# Patient Record
Sex: Female | Born: 1955
Health system: Southern US, Community
[De-identification: ages and names within clinical notes are randomized; demographics above are authoritative.]

## PROBLEM LIST (undated history)

## (undated) ENCOUNTER — Emergency Department: Admission: EM | Discharge status: Absent without leave | Payer: Medicare Other

## (undated) DIAGNOSIS — F419 Anxiety disorder, unspecified: Secondary | ICD-10-CM

## (undated) DIAGNOSIS — E119 Type 2 diabetes mellitus without complications: Secondary | ICD-10-CM

## (undated) DIAGNOSIS — M419 Scoliosis, unspecified: Secondary | ICD-10-CM

## (undated) DIAGNOSIS — G4730 Sleep apnea, unspecified: Secondary | ICD-10-CM

## (undated) DIAGNOSIS — G8929 Other chronic pain: Secondary | ICD-10-CM

## (undated) DIAGNOSIS — F32A Depression, unspecified: Secondary | ICD-10-CM

## (undated) DIAGNOSIS — G2581 Restless legs syndrome: Secondary | ICD-10-CM

## (undated) DIAGNOSIS — F329 Major depressive disorder, single episode, unspecified: Secondary | ICD-10-CM

## (undated) DIAGNOSIS — G4700 Insomnia, unspecified: Secondary | ICD-10-CM

## (undated) DIAGNOSIS — E785 Hyperlipidemia, unspecified: Secondary | ICD-10-CM

## (undated) DIAGNOSIS — R55 Syncope and collapse: Secondary | ICD-10-CM

## (undated) DIAGNOSIS — K5730 Diverticulosis of large intestine without perforation or abscess without bleeding: Secondary | ICD-10-CM

## (undated) HISTORY — DX: Insomnia, unspecified: G47.00

## (undated) HISTORY — DX: Restless legs syndrome: G25.81

## (undated) HISTORY — DX: Diverticulosis of large intestine without perforation or abscess without bleeding: K57.30

## (undated) HISTORY — PX: APPENDECTOMY: SHX54

## (undated) HISTORY — DX: Syncope and collapse: R55

## (undated) HISTORY — DX: Sleep apnea, unspecified: G47.30

## (undated) HISTORY — PX: BREAST EXCISIONAL BIOPSY: SUR124

## (undated) HISTORY — PX: ABDOMINAL HYSTERECTOMY: SHX81

## (undated) HISTORY — PX: KNEE SURGERY: SHX244

## (undated) HISTORY — DX: Type 2 diabetes mellitus without complications: E11.9

## (undated) HISTORY — DX: Hyperlipidemia, unspecified: E78.5

## (undated) HISTORY — DX: Other chronic pain: G89.29

---

## 2004-10-15 ENCOUNTER — Ambulatory Visit: Payer: Self-pay

## 2004-10-17 ENCOUNTER — Ambulatory Visit: Payer: Self-pay | Admitting: Unknown Physician Specialty

## 2004-10-30 ENCOUNTER — Other Ambulatory Visit: Payer: Self-pay

## 2004-11-03 ENCOUNTER — Ambulatory Visit: Payer: Self-pay | Admitting: Surgery

## 2005-01-16 ENCOUNTER — Ambulatory Visit: Payer: Self-pay

## 2008-07-23 ENCOUNTER — Ambulatory Visit: Payer: Self-pay | Admitting: Family Medicine

## 2009-01-09 DIAGNOSIS — I10 Essential (primary) hypertension: Secondary | ICD-10-CM | POA: Insufficient documentation

## 2009-01-09 DIAGNOSIS — J4530 Mild persistent asthma, uncomplicated: Secondary | ICD-10-CM | POA: Insufficient documentation

## 2010-08-22 DIAGNOSIS — M1710 Unilateral primary osteoarthritis, unspecified knee: Secondary | ICD-10-CM | POA: Insufficient documentation

## 2010-10-03 ENCOUNTER — Emergency Department: Payer: Self-pay | Admitting: Emergency Medicine

## 2011-05-12 DIAGNOSIS — G4733 Obstructive sleep apnea (adult) (pediatric): Secondary | ICD-10-CM | POA: Insufficient documentation

## 2011-08-11 DIAGNOSIS — H905 Unspecified sensorineural hearing loss: Secondary | ICD-10-CM | POA: Insufficient documentation

## 2012-12-20 DIAGNOSIS — K5792 Diverticulitis of intestine, part unspecified, without perforation or abscess without bleeding: Secondary | ICD-10-CM | POA: Insufficient documentation

## 2013-01-09 DIAGNOSIS — G8929 Other chronic pain: Secondary | ICD-10-CM | POA: Insufficient documentation

## 2013-01-09 DIAGNOSIS — M25569 Pain in unspecified knee: Secondary | ICD-10-CM | POA: Insufficient documentation

## 2014-03-03 ENCOUNTER — Emergency Department: Payer: Self-pay | Admitting: Emergency Medicine

## 2014-03-03 LAB — BASIC METABOLIC PANEL
ANION GAP: 7 (ref 7–16)
BUN: 12 mg/dL (ref 7–18)
CREATININE: 0.66 mg/dL (ref 0.60–1.30)
Calcium, Total: 8.5 mg/dL (ref 8.5–10.1)
Chloride: 105 mmol/L (ref 98–107)
Co2: 29 mmol/L (ref 21–32)
EGFR (African American): >60
EGFR (Non-African Amer.): >60
GLUCOSE: 165 mg/dL — AB (ref 65–99)
Osmolality: 285 (ref 275–301)
POTASSIUM: 3.9 mmol/L (ref 3.5–5.1)
SODIUM: 141 mmol/L (ref 136–145)

## 2014-03-03 LAB — TROPONIN I

## 2014-03-03 LAB — CBC
HCT: 39.6 % (ref 35.0–47.0)
HGB: 13 g/dL (ref 12.0–16.0)
MCH: 27.8 pg (ref 26.0–34.0)
MCHC: 32.8 g/dL (ref 32.0–36.0)
MCV: 85 fL (ref 80–100)
Platelet: 230 10*3/uL (ref 150–440)
RBC: 4.68 10*6/uL (ref 3.80–5.20)
RDW: 14.2 % (ref 11.5–14.5)
WBC: 7.3 10*3/uL (ref 3.6–11.0)

## 2014-03-06 DIAGNOSIS — F329 Major depressive disorder, single episode, unspecified: Secondary | ICD-10-CM | POA: Insufficient documentation

## 2014-03-06 DIAGNOSIS — F32A Depression, unspecified: Secondary | ICD-10-CM | POA: Insufficient documentation

## 2014-03-06 DIAGNOSIS — I10 Essential (primary) hypertension: Secondary | ICD-10-CM | POA: Insufficient documentation

## 2014-03-06 DIAGNOSIS — E119 Type 2 diabetes mellitus without complications: Secondary | ICD-10-CM | POA: Insufficient documentation

## 2014-03-06 DIAGNOSIS — E782 Mixed hyperlipidemia: Secondary | ICD-10-CM | POA: Insufficient documentation

## 2014-04-24 DIAGNOSIS — R55 Syncope and collapse: Secondary | ICD-10-CM | POA: Insufficient documentation

## 2014-04-27 DIAGNOSIS — K228 Other specified diseases of esophagus: Secondary | ICD-10-CM | POA: Insufficient documentation

## 2014-04-27 DIAGNOSIS — K2289 Other specified disease of esophagus: Secondary | ICD-10-CM | POA: Insufficient documentation

## 2014-08-07 DIAGNOSIS — J410 Simple chronic bronchitis: Secondary | ICD-10-CM | POA: Insufficient documentation

## 2014-08-07 DIAGNOSIS — R5382 Chronic fatigue, unspecified: Secondary | ICD-10-CM

## 2014-08-07 DIAGNOSIS — G2581 Restless legs syndrome: Secondary | ICD-10-CM | POA: Insufficient documentation

## 2014-08-07 DIAGNOSIS — D8989 Other specified disorders involving the immune mechanism, not elsewhere classified: Secondary | ICD-10-CM | POA: Insufficient documentation

## 2014-08-07 DIAGNOSIS — G4700 Insomnia, unspecified: Secondary | ICD-10-CM | POA: Insufficient documentation

## 2014-10-11 ENCOUNTER — Emergency Department
Admission: EM | Admit: 2014-10-11 | Discharge: 2014-10-11 | Disposition: A | Discharge status: Home / Self Care | Payer: Medicare Other | Attending: Emergency Medicine | Admitting: Emergency Medicine

## 2014-10-11 ENCOUNTER — Encounter: Payer: Self-pay | Admitting: Emergency Medicine

## 2014-10-11 DIAGNOSIS — Z79899 Other long term (current) drug therapy: Secondary | ICD-10-CM | POA: Insufficient documentation

## 2014-10-11 DIAGNOSIS — Z7982 Long term (current) use of aspirin: Secondary | ICD-10-CM | POA: Diagnosis not present

## 2014-10-11 DIAGNOSIS — M25511 Pain in right shoulder: Secondary | ICD-10-CM | POA: Diagnosis present

## 2014-10-11 DIAGNOSIS — M75101 Unspecified rotator cuff tear or rupture of right shoulder, not specified as traumatic: Secondary | ICD-10-CM | POA: Diagnosis not present

## 2014-10-11 DIAGNOSIS — M67911 Unspecified disorder of synovium and tendon, right shoulder: Secondary | ICD-10-CM

## 2014-10-11 HISTORY — DX: Scoliosis, unspecified: M41.9

## 2014-10-11 HISTORY — DX: Anxiety disorder, unspecified: F41.9

## 2014-10-11 HISTORY — DX: Depression, unspecified: F32.A

## 2014-10-11 HISTORY — DX: Major depressive disorder, single episode, unspecified: F32.9

## 2014-10-11 MED ORDER — NAPROXEN 500 MG PO TABS: 500.0000 mg | ORAL_TABLET | Freq: Two times a day (BID) | ORAL | Status: DC

## 2014-10-11 NOTE — ED Provider Notes (Signed)
[facility] Emergency Department Provider Note  ____________________________________________  Time seen: On arrival  I have reviewed the triage vital signs and the nursing notes.   HISTORY  Chief Complaint Arm Pain    HPI Shelby Butler is a 59 y.o. female who presents with complaints of right shoulder pain since January 2016. At that time she fell and injured her shoulder and she reports this continued to bother her. It is worse when she tries to lift it above her head. She denies tingling or numbness in the extremity. No chest pain. Her elbow and wrist do not hurt. She did go to physical therapy twice but became too expensive. If She is not moving the arm she has no pain.    Past Medical History  Diagnosis Date  . Depression   . Anxiety   . Scoliosis     There are no active problems to display for this patient.   History reviewed. No pertinent past surgical history.  Current Outpatient Rx  Name  Route  Sig  Dispense  Refill  . ADVAIR DISKUS 250-50 MCG/DOSE AEPB   Oral   Take 1 puff by mouth 2 (two) times daily.      11     Dispense as written.   Marland Kitchen aspirin EC 81 MG tablet   Oral   Take 81 mg by mouth daily.         Marland Kitchen atorvastatin (LIPITOR) 40 MG tablet   Oral   Take 1 tablet by mouth daily.      11   . baclofen (LIORESAL) 10 MG tablet   Oral   Take 1 tablet by mouth 2 (two) times daily as needed.      0   . buPROPion (WELLBUTRIN XL) 150 MG 24 hr tablet   Oral   Take 3 tablets by mouth every morning.      2   . gabapentin (NEURONTIN) 800 MG tablet   Oral   Take 1 tablet by mouth at bedtime.      3   . isosorbide mononitrate (IMDUR) 30 MG 24 hr tablet   Oral   Take 1 tablet by mouth daily.         Marland Kitchen lisinopril (PRINIVIL,ZESTRIL) 5 MG tablet   Oral   Take 1 tablet by mouth daily.      3   . metformin (FORTAMET) 1000 MG (OSM) 24 hr tablet   Oral   Take 1 tablet by mouth 2 (two) times daily with a meal.       3   . metoprolol tartrate (LOPRESSOR) 25 MG tablet   Oral   Take 12.5 mg by mouth 2 (two) times daily.          . traZODone (DESYREL) 50 MG tablet   Oral   Take 1 tablet by mouth at bedtime.      0   . naproxen (NAPROSYN) 500 MG tablet   Oral   Take 1 tablet (500 mg total) by mouth 2 (two) times daily with a meal.   20 tablet   2   . PROAIR HFA 108 (90 BASE) MCG/ACT inhaler   Oral   Take 2 puffs by mouth every 4 (four) hours as needed.      5     Dispense as written.     Allergies Review of patient's allergies indicates no known allergies.  No family history on file.  Social History History  Substance Use Topics  .  Smoking status: Not on file  . Smokeless tobacco: Not on file  . Alcohol Use: Not on file    Review of Systems  Constitutional: Negative for fever. Eyes: Negative for visual changes. ENT: Negative for sore throat   Genitourinary: Negative for dysuria. Musculoskeletal: Negative for back pain. Skin: Negative for rash. Neurological: Negative for headaches or focal weakness   ____________________________________________   PHYSICAL EXAM:  VITAL SIGNS: ED Triage Vitals  Enc Vitals Group     BP 10/11/14 0949 139/77 mmHg     Pulse Rate 10/11/14 0949 67     Resp 10/11/14 0949 20     Temp 10/11/14 0949 98.6 F (37 C)     Temp Source 10/11/14 0949 Oral     SpO2 10/11/14 0949 96 %     Weight 10/11/14 0949 168 lb (76.204 kg)     Height 10/11/14 0949  (1.6 m)     Head Cir --      Peak Flow --      Pain Score 10/11/14 0949 8     Pain Loc --      Pain Edu? --      Excl. in GC? --      Constitutional: Alert and oriented. Well appearing and in no distress. Eyes: Conjunctivae are normal.  ENT   Head: Normocephalic and atraumatic.   Mouth/Throat: Mucous membranes are moist. Cardiovascular: Normal rate, regular rhythm.  Respiratory: Normal respiratory effort without tachypnea nor retractions.  Gastrointestinal: Soft and  non-tender in all quadrants. No distention. There is no CVA tenderness. Musculoskeletal: Right arm with mild tenderness to palpation in the anterior shoulder. She is unable to bring her arm above 90. There is no bony tenderness to palpation or abnormality. Soft compartments. Normal cap refill and normal pulses distal extremity Neurologic:  Normal speech and language. No gross focal neurologic deficits are appreciated. Skin:  Skin is warm, dry and intact. No rash noted. Psychiatric: Mood and affect are normal. Patient exhibits appropriate insight and judgment.  ____________________________________________    LABS (pertinent positives/negatives)  Labs Reviewed - No data to display  ____________________________________________     ____________________________________________    RADIOLOGY I have personally reviewed any xrays that were ordered on this patient: None  ____________________________________________   PROCEDURES  Procedure(s) performed: none   ____________________________________________   INITIAL IMPRESSION / ASSESSMENT AND PLAN / ED COURSE  Pertinent labs & imaging results that were available during my care of the patient were reviewed by me and considered in my medical decision making (see chart for details).  History of present illness an exam most consistent with rotator cuff injury. I discussed with her how she will need an MRI follow-up with orthopedics. We'll treat her with NSAIDs and she agrees with this plan  ____________________________________________   FINAL CLINICAL IMPRESSION(S) / ED DIAGNOSES  Final diagnoses:  Dysfunction of right rotator cuff     Jene Every, MD 10/11/14 1053

## 2014-10-11 NOTE — ED Notes (Signed)
Computer would not load in room, unable to e-sign for discharge.

## 2014-10-11 NOTE — Discharge Instructions (Signed)
Rotator Cuff Injury Rotator cuff injury is any type of injury to the set of muscles and tendons that make up the stabilizing unit of your shoulder. This unit holds the ball of your upper arm bone (humerus) in the socket of your shoulder blade (scapula).  CAUSES Injuries to your rotator cuff most commonly come from sports or activities that cause your arm to be moved repeatedly over your head. Examples of this include throwing, weight lifting, swimming, or racquet sports. Long lasting (chronic) irritation of your rotator cuff can cause soreness and swelling (inflammation), bursitis, and eventual damage to your tendons, such as a tear (rupture). SIGNS AND SYMPTOMS Acute rotator cuff tear:  Sudden tearing sensation followed by severe pain shooting from your upper shoulder down your arm toward your elbow.  Decreased range of motion of your shoulder because of pain and muscle spasm.  Severe pain.  Inability to raise your arm out to the side because of pain and loss of muscle power (large tears). Chronic rotator cuff tear:  Pain that usually is worse at night and may interfere with sleep.  Gradual weakness and decreased shoulder motion as the pain worsens.  Decreased range of motion. Rotator cuff tendinitis:  Deep ache in your shoulder and the outside upper arm over your shoulder.  Pain that comes on gradually and becomes worse when lifting your arm to the side or turning it inward. DIAGNOSIS Rotator cuff injury is diagnosed through a medical history, physical exam, and imaging exam. The medical history helps determine the type of rotator cuff injury. Your health care provider will look at your injured shoulder, feel the injured area, and ask you to move your shoulder in different positions. X-ray exams typically are done to rule out other causes of shoulder pain, such as fractures. MRI is the exam of choice for the most severe shoulder injuries because the images show muscles and tendons.    TREATMENT  Chronic tear:  Medicine for pain, such as acetaminophen or ibuprofen.  Physical therapy and range-of-motion exercises may be helpful in maintaining shoulder function and strength.  Steroid injections into your shoulder joint.  Surgical repair of the rotator cuff if the injury does not heal with noninvasive treatment. Acute tear:  Anti-inflammatory medicines such as ibuprofen and naproxen to help reduce pain and swelling.  A sling to help support your arm and rest your rotator cuff muscles. Long-term use of a sling is not advised. It may cause significant stiffening of the shoulder joint.  Surgery may be considered within a few weeks, especially in younger, active people, to return the shoulder to full function.  Indications for surgical treatment include the following:  Age younger than 60 years.  Rotator cuff tears that are complete.  Physical therapy, rest, and anti-inflammatory medicines have been used for 6-8 weeks, with no improvement.  Employment or sporting activity that requires constant shoulder use. Tendinitis:  Anti-inflammatory medicines such as ibuprofen and naproxen to help reduce pain and swelling.  A sling to help support your arm and rest your rotator cuff muscles. Long-term use of a sling is not advised. It may cause significant stiffening of the shoulder joint.  Severe tendinitis may require:  Steroid injections into your shoulder joint.  Physical therapy.  Surgery. HOME CARE INSTRUCTIONS   Apply ice to your injury:  Put ice in a plastic bag.  Place a towel between your skin and the bag.  Leave the ice on for 20 minutes, 2-3 times a day.  If you   have a shoulder immobilizer (sling and straps), wear it until told otherwise by your health care provider.  You may want to sleep on several pillows or in a recliner at night to lessen swelling and pain.  Only take over-the-counter or prescription medicines for pain, discomfort, or fever as  directed by your health care provider.  Do simple hand squeezing exercises with a soft rubber ball to decrease hand swelling. SEEK MEDICAL CARE IF:   Your shoulder pain increases, or new pain or numbness develops in your arm, hand, or fingers.  Your hand or fingers are colder than your other hand. SEEK IMMEDIATE MEDICAL CARE IF:   Your arm, hand, or fingers are numb or tingling.  Your arm, hand, or fingers are increasingly swollen and painful, or they turn white or blue. MAKE SURE YOU:  Understand these instructions.  Will watch your condition.  Will get help right away if you are not doing well or get worse. Document Released: 02/21/2000 Document Revised: 02/28/2013 Document Reviewed: 10/05/2012 Exit[employer] Patient Information 2015 [employer]. This information is not intended to replace advice given to you by your health care provider. Make sure you discuss any questions you have with your health care provider.  

## 2014-10-11 NOTE — ED Notes (Signed)
Patient presents to the ED with right arm and right shoulder pain since an accident in January.  Patient states she has been going to physical therapy but it hasn't improved her pain.  Patient states she has lately been having difficulty sleeping due to the pain.

## 2014-10-11 NOTE — ED Notes (Signed)
January of 2016 fell on cement, increasing pain , difficulty with full ROM

## 2014-10-12 DIAGNOSIS — M7520 Bicipital tendinitis, unspecified shoulder: Secondary | ICD-10-CM | POA: Insufficient documentation

## 2014-10-16 ENCOUNTER — Other Ambulatory Visit: Payer: Self-pay | Admitting: Specialist

## 2014-10-16 DIAGNOSIS — M7521 Bicipital tendinitis, right shoulder: Secondary | ICD-10-CM

## 2014-10-20 ENCOUNTER — Ambulatory Visit
Admission: RE | Admit: 2014-10-20 | Discharge: 2014-10-20 | Disposition: A | Discharge status: Home / Self Care | Payer: Medicare Other | Source: Ambulatory Visit | Attending: Specialist | Admitting: Specialist

## 2014-10-20 DIAGNOSIS — S46811A Strain of other muscles, fascia and tendons at shoulder and upper arm level, right arm, initial encounter: Secondary | ICD-10-CM | POA: Diagnosis not present

## 2014-10-20 DIAGNOSIS — M7521 Bicipital tendinitis, right shoulder: Secondary | ICD-10-CM | POA: Diagnosis present

## 2014-10-20 DIAGNOSIS — S46111A Strain of muscle, fascia and tendon of long head of biceps, right arm, initial encounter: Secondary | ICD-10-CM | POA: Insufficient documentation

## 2014-10-23 ENCOUNTER — Ambulatory Visit: Payer: Medicare Other

## 2014-10-29 ENCOUNTER — Encounter: Payer: Self-pay | Admitting: Unknown Physician Specialty

## 2014-10-29 ENCOUNTER — Ambulatory Visit (INDEPENDENT_AMBULATORY_CARE_PROVIDER_SITE_OTHER): Payer: Medicare Other | Admitting: Unknown Physician Specialty

## 2014-10-29 VITALS — BP 101/64 | HR 80 | Temp 97.8°F | Ht 64.0 in | Wt 170.2 lb

## 2014-10-29 DIAGNOSIS — G4700 Insomnia, unspecified: Secondary | ICD-10-CM | POA: Diagnosis not present

## 2014-10-29 DIAGNOSIS — N189 Chronic kidney disease, unspecified: Secondary | ICD-10-CM

## 2014-10-29 DIAGNOSIS — E1122 Type 2 diabetes mellitus with diabetic chronic kidney disease: Secondary | ICD-10-CM

## 2014-10-29 DIAGNOSIS — E785 Hyperlipidemia, unspecified: Secondary | ICD-10-CM | POA: Diagnosis not present

## 2014-10-29 DIAGNOSIS — G930 Cerebral cysts: Secondary | ICD-10-CM | POA: Insufficient documentation

## 2014-10-29 DIAGNOSIS — I129 Hypertensive chronic kidney disease with stage 1 through stage 4 chronic kidney disease, or unspecified chronic kidney disease: Secondary | ICD-10-CM | POA: Diagnosis not present

## 2014-10-29 LAB — BAYER DCA HB A1C WAIVED: HB A1C (BAYER DCA - WAIVED): 6.7 % (ref <7.0)

## 2014-10-29 LAB — MICROALBUMIN, URINE WAIVED
Creatinine, Urine Waived: 300 mg/dL (ref 10–300)
Microalb, Ur Waived: 80 mg/L — ABNORMAL HIGH (ref 0–19)

## 2014-10-29 NOTE — Assessment & Plan Note (Addendum)
Last labs done 6 months ago.  Hgb A1C is 6.7.

## 2014-10-29 NOTE — Assessment & Plan Note (Signed)
Lipid panel and liver enzymes done at cardiology

## 2014-10-29 NOTE — Progress Notes (Signed)
BP 101/64 mmHg  Pulse 80  Temp(Src) 97.8 F (36.6 C)  Ht 5\' 4"  (1.626 m)  Wt 170 lb 3.2 oz (77.202 kg)  BMI 29.20 kg/m2  SpO2 95%  LMP  (LMP Unknown)   Subjective:    Patient ID: Shelby Butler, female    DOB: 10/12/1955, 59 y.o.   MRN: [id number]  HPI: Shelby Butler is a 58 y.o. female  Chief Complaint  Patient presents with  . Establish Care   Diabetes She presents for her follow-up diabetic visit. She has type 2 diabetes mellitus. There are no hypoglycemic associated symptoms. There are no diabetic associated symptoms. Pertinent negatives for diabetes include no chest pain. There are no hypoglycemic complications. Symptoms are stable. There are no diabetic complications. Her weight is stable. She is following a diabetic diet. Eye exam is not current.  Hypertension This is a chronic problem. The problem is controlled. Pertinent negatives include no chest pain or shortness of breath. There are no compliance problems.   Hyperlipidemia This is a chronic (Under the care of Dr. Gwen Pounds) problem. The problem is controlled. Recent lipid tests were reviewed and are normal. Pertinent negatives include no chest pain, focal sensory loss, focal weakness, leg pain, myalgias or shortness of breath. There are no compliance problems.   Insomnia Primary symptoms: fragmented sleep, difficulty falling asleep, somnolence, frequent awakening.  The current episode started more than one month. The problem occurs nightly. The problem is unchanged. The symptoms are aggravated by caffeine and pain. How many beverages per day that contain caffeine: 2-3.  Types of beverages you drink: soda. Treatments tried: Trazadone. PMH includes: hypertension.    Relevant past medical, surgical, family and social history reviewed and updated as indicated. Interim medical history since our last visit reviewed. Allergies and medications reviewed and updated.  Review of Systems  Respiratory: Negative for shortness of  breath.   Cardiovascular: Negative for chest pain.  Musculoskeletal: Negative for myalgias.       Chronic shoulder pain right shoulder under the care of Dr. Hyacinth Meeker  Neurological: Negative for focal weakness.  Psychiatric/Behavioral: The patient has insomnia.    Per HPI unless specifically indicated above     Objective:    BP 101/64 mmHg  Pulse 80  Temp(Src) 97.8 F (36.6 C)  Ht 5\' 4"  (1.626 m)  Wt 170 lb 3.2 oz (77.202 kg)  BMI 29.20 kg/m2  SpO2 95%  LMP  (LMP Unknown)  Wt Readings from Last 3 Encounters:  10/29/14 170 lb 3.2 oz (77.202 kg)  10/11/14 168 lb (76.204 kg)    Physical Exam  Constitutional: She is oriented to person, place, and time. She appears well-developed and well-nourished. No distress.  HENT:  Head: Normocephalic and atraumatic.  Eyes: Conjunctivae and lids are normal. Right eye exhibits no discharge. Left eye exhibits no discharge. No scleral icterus.  Cardiovascular: Normal rate, regular rhythm and normal heart sounds.   Pulmonary/Chest: Effort normal and breath sounds normal. No respiratory distress.  Abdominal: Normal appearance and bowel sounds are normal. There is no splenomegaly or hepatomegaly.  Musculoskeletal: Normal range of motion.  Neurological: She is alert and oriented to person, place, and time.  Skin: Skin is intact. No rash noted. No pallor.  Psychiatric: She has a normal mood and affect. Her behavior is normal. Judgment and thought content normal.   Assessment & Plan:   Problem List Items Addressed This Visit      Unprioritized   Benign essential HTN   HLD (  hyperlipidemia)    Lipid panel and liver enzymes done at cardiology      Cannot sleep    Chronic insomnia.  Discussed discontinuing caffeine and working on an exercise program.        Relevant Orders   TSH   Diabetes mellitus, type 2 - Primary    Last labs done 6 months ago.  Hgb A1C is 6.7.        Relevant Orders   Comprehensive metabolic panel   Bayer DCA Hb W0J  Waived   Microalbumin, Urine Waived       Follow up plan: Return for Schedule physical.

## 2014-10-29 NOTE — Assessment & Plan Note (Signed)
Chronic insomnia.  Discussed discontinuing caffeine and working on an exercise program.

## 2014-10-29 NOTE — Patient Instructions (Addendum)
Insomnia Insomnia is frequent trouble falling and/or staying asleep. Insomnia can be a long term problem or a short term problem. Both are common. Insomnia can be a short term problem when the wakefulness is related to a certain stress or worry. Long term insomnia is often related to ongoing stress during waking hours and/or poor sleeping habits. Overtime, sleep deprivation itself can make the problem worse. Every little thing feels more severe because you are overtired and your ability to cope is decreased. CAUSES   Stress, anxiety, and depression.  Poor sleeping habits.  Distractions such as TV in the bedroom.  Naps close to bedtime.  Engaging in emotionally charged conversations before bed.  Technical reading before sleep.  Alcohol and other sedatives. They may make the problem worse. They can hurt normal sleep patterns and normal dream activity.  Stimulants such as caffeine for several hours prior to bedtime.  Pain syndromes and shortness of breath can cause insomnia.  Exercise late at night.  Changing time zones may cause sleeping problems (jet lag). It is sometimes helpful to have someone observe your sleeping patterns. They should look for periods of not breathing during the night (sleep apnea). They should also look to see how long those periods last. If you live alone or observers are uncertain, you can also be observed at a sleep clinic where your sleep patterns will be professionally monitored. Sleep apnea requires a checkup and treatment. Give your caregivers your medical history. Give your caregivers observations your family has made about your sleep.  SYMPTOMS   Not feeling rested in the morning.  Anxiety and restlessness at bedtime.  Difficulty falling and staying asleep. TREATMENT   Your caregiver may prescribe treatment for an underlying medical disorders. Your caregiver can give advice or help if you are using alcohol or other drugs for self-medication. Treatment  of underlying problems will usually eliminate insomnia problems.  Medications can be prescribed for short time use. They are generally not recommended for lengthy use.  Over-the-counter sleep medicines are not recommended for lengthy use. They can be habit forming.  You can promote easier sleeping by making lifestyle changes such as:  Using relaxation techniques that help with breathing and reduce muscle tension.  Exercising earlier in the day.  Changing your diet and the time of your last meal. No night time snacks.  Establish a regular time to go to bed.  Counseling can help with stressful problems and worry.  Soothing music and white noise may be helpful if there are background noises you cannot remove.  Stop tedious detailed work at least one hour before bedtime. HOME CARE INSTRUCTIONS   Keep a diary. Inform your caregiver about your progress. This includes any medication side effects. See your caregiver regularly. Take note of:  Times when you are asleep.  Times when you are awake during the night.  The quality of your sleep.  How you feel the next day. This information will help your caregiver care for you.  Get out of bed if you are still awake after 15 minutes. Read or do some quiet activity. Keep the lights down. Wait until you feel sleepy and go back to bed.  Keep regular sleeping and waking hours. Avoid naps.  Exercise regularly.  Avoid distractions at bedtime. Distractions include watching television or engaging in any intense or detailed activity like attempting to balance the household checkbook.  Develop a bedtime ritual. Keep a familiar routine of bathing, brushing your teeth, climbing into bed at the same   time each night, listening to soothing music. Routines increase the success of falling to sleep faster.  Use relaxation techniques. This can be using breathing and muscle tension release routines. It can also include visualizing peaceful scenes. You can  also help control troubling or intruding thoughts by keeping your mind occupied with boring or repetitive thoughts like the old concept of counting sheep. You can make it more creative like imagining planting one beautiful flower after another in your backyard garden.  During your day, work to eliminate stress. When this is not possible use some of the previous suggestions to help reduce the anxiety that accompanies stressful situations. MAKE SURE YOU:   Understand these instructions.  Will watch your condition.  Will get help right away if you are not doing well or get worse. Document Released: 02/21/2000 Document Revised: 05/18/2011 Document Reviewed: 03/23/2007 Exit[employer] Patient Information 2015 [employer]. This information is not intended to replace advice given to you by your health care provider. Make sure you discuss any questions you have with your health care provider.  

## 2014-10-30 ENCOUNTER — Encounter: Payer: Self-pay | Admitting: Unknown Physician Specialty

## 2014-10-30 LAB — COMPREHENSIVE METABOLIC PANEL
A/G RATIO: 2.2 (ref 1.1–2.5)
ALT: 16 IU/L (ref 0–32)
AST: 11 IU/L (ref 0–40)
Albumin: 4.3 g/dL (ref 3.5–5.5)
Alkaline Phosphatase: 89 IU/L (ref 39–117)
BUN/Creatinine Ratio: 24 — ABNORMAL HIGH (ref 9–23)
BUN: 20 mg/dL (ref 6–24)
Bilirubin Total: 0.4 mg/dL (ref 0.0–1.2)
CHLORIDE: 100 mmol/L (ref 97–108)
CO2: 27 mmol/L (ref 18–29)
Calcium: 9 mg/dL (ref 8.7–10.2)
Creatinine, Ser: 0.85 mg/dL (ref 0.57–1.00)
GFR, EST AFRICAN AMERICAN: 89 mL/min/{1.73_m2} (ref >59)
GFR, EST NON AFRICAN AMERICAN: 77 mL/min/{1.73_m2} (ref >59)
GLOBULIN, TOTAL: 2 g/dL (ref 1.5–4.5)
Glucose: 129 mg/dL — ABNORMAL HIGH (ref 65–99)
POTASSIUM: 4.5 mmol/L (ref 3.5–5.2)
SODIUM: 144 mmol/L (ref 134–144)
TOTAL PROTEIN: 6.3 g/dL (ref 6.0–8.5)

## 2014-10-30 LAB — URIC ACID: Uric Acid: 3.6 mg/dL (ref 2.5–7.1)

## 2014-10-30 LAB — TSH: TSH: 1.98 u[IU]/mL (ref 0.450–4.500)

## 2014-11-15 ENCOUNTER — Other Ambulatory Visit: Payer: Medicare Other

## 2014-11-21 ENCOUNTER — Encounter: Admission: RE | Payer: Self-pay | Source: Ambulatory Visit

## 2014-11-21 ENCOUNTER — Ambulatory Visit: Admission: RE | Admit: 2014-11-21 | Payer: Medicare Other | Source: Ambulatory Visit | Admitting: Specialist

## 2014-11-21 SURGERY — ARTHROSCOPY, SHOULDER WITH REPAIR, ROTATOR CUFF, OPEN
Anesthesia: Choice | Laterality: Right

## 2014-12-11 ENCOUNTER — Encounter: Payer: Self-pay | Admitting: Unknown Physician Specialty

## 2014-12-11 ENCOUNTER — Ambulatory Visit (INDEPENDENT_AMBULATORY_CARE_PROVIDER_SITE_OTHER): Payer: Medicare Other | Admitting: Unknown Physician Specialty

## 2014-12-11 VITALS — BP 118/70 | HR 74 | Temp 98.1°F | Ht 64.0 in | Wt 177.2 lb

## 2014-12-11 DIAGNOSIS — Z23 Encounter for immunization: Secondary | ICD-10-CM | POA: Diagnosis not present

## 2014-12-11 DIAGNOSIS — Z0000 Encounter for general adult medical examination without abnormal findings: Secondary | ICD-10-CM

## 2014-12-11 NOTE — Progress Notes (Signed)
++++++++++++++++++++++++++++++++++++++++++++++++++++++++++++++++++++++++++++++++++  BP 118/70 mmHg  Pulse 74  Temp(Src) 98.1 F (36.7 C)  Ht  (1.626 m)  Wt 177 lb 3.2 oz (80.377 kg)  BMI 30.40 kg/m2  SpO2 94%  LMP  (LMP Unknown)   Subjective:    Patient ID: Shelby Butler, female    DOB: 16-Dec-1955, 59 y.o.   MRN: [id number]  HPI: Shelby Butler is a 59 y.o. female  Chief Complaint  Patient presents with  . Annual Exam    Relevant past medical, surgical, family and social history reviewed and updated as indicated. Interim medical history since our last visit reviewed. Allergies and medications reviewed and updated.  Chronic disease f/u done last visit.    Review of Systems  Constitutional: Negative.   HENT: Negative.   Eyes: Negative.   Respiratory: Negative.   Cardiovascular: Negative.   Gastrointestinal: Negative.   Endocrine: Negative.   Genitourinary: Negative.   Musculoskeletal: Negative.        Getting shoulder surgery at [facility]  Skin: Negative.   Allergic/Immunologic: Negative.   Neurological: Negative.   Hematological: Negative.   Psychiatric/Behavioral: Negative.     Per HPI unless specifically indicated above     Objective:    BP 118/70 mmHg  Pulse 74  Temp(Src) 98.1 F (36.7 C)  Ht  (1.626 m)  Wt 177 lb 3.2 oz (80.377 kg)  BMI 30.40 kg/m2  SpO2 94%  LMP  (LMP Unknown)  Wt Readings from Last 3 Encounters:  12/11/14 177 lb 3.2 oz (80.377 kg)  10/29/14 170 lb 3.2 oz (77.202 kg)  10/11/14 168 lb (76.204 kg)    Physical Exam  Constitutional: She is oriented to person, place, and time. She appears well-developed and well-nourished.  HENT:  Head: Normocephalic and atraumatic.  Eyes: Pupils are equal, round, and reactive to light. Right eye exhibits no discharge. Left eye exhibits no discharge. No scleral icterus.  Neck: Normal range of motion. Neck supple. Carotid bruit is not present. No thyromegaly present.  Cardiovascular: Normal  rate, regular rhythm and normal heart sounds.  Exam reveals no gallop and no friction rub.   No murmur heard. Pulmonary/Chest: Effort normal and breath sounds normal. No respiratory distress. She has no wheezes. She has no rales.  Abdominal: Soft. Bowel sounds are normal. There is no tenderness. There is no rebound.  Genitourinary: No breast swelling, tenderness or discharge.  Musculoskeletal: Normal range of motion.  Lymphadenopathy:    She has no cervical adenopathy.  Neurological: She is alert and oriented to person, place, and time.  Skin: Skin is warm, dry and intact. No rash noted.  Psychiatric: She has a normal mood and affect. Her speech is normal and behavior is normal. Judgment and thought content normal. Cognition and memory are normal.   Diabetic Foot Exam - Simple   Simple Foot Form  Diabetic Foot exam was performed with the following findings:  Yes 12/11/2014  2:34 PM  Visual Inspection  No deformities, no ulcerations, no other skin breakdown bilaterally:  Yes  Sensation Testing  Intact to touch and monofilament testing bilaterally:  Yes  Pulse Check  Posterior Tibialis and Dorsalis pulse intact bilaterally:  Yes  Comments  She does have some erythema but no breakdown toes.       Results for orders placed or performed in visit on 10/29/14  Comprehensive metabolic panel  Result Value Ref Range   Glucose 129 (H) 65 - 99 mg/dL   BUN 20 6 - 24 mg/dL  Creatinine, Ser 0.85 0.57 - 1.00 mg/dL   GFR calc non Af Amer 77 >59 mL/min/1.73   GFR calc Af Amer 89 >59 mL/min/1.73   BUN/Creatinine Ratio 24 (H) 9 - 23   Sodium 144 134 - 144 mmol/L   Potassium 4.5 3.5 - 5.2 mmol/L   Chloride 100 97 - 108 mmol/L   CO2 27 18 - 29 mmol/L   Calcium 9.0 8.7 - 10.2 mg/dL   Total Protein 6.3 6.0 - 8.5 g/dL   Albumin 4.3 3.5 - 5.5 g/dL   Globulin, Total 2.0 1.5 - 4.5 g/dL   Albumin/Globulin Ratio 2.2 1.1 - 2.5   Bilirubin Total 0.4 0.0 - 1.2 mg/dL   Alkaline Phosphatase 89 39 - 117 IU/L    AST 11 0 - 40 IU/L   ALT 16 0 - 32 IU/L  Bayer DCA Hb A1c Waived  Result Value Ref Range   Bayer DCA Hb A1c Waived 6.7 <7.0 %  Microalbumin, Urine Waived  Result Value Ref Range   Microalb, Ur Waived 80 (H) 0 - 19 mg/L   Creatinine, Urine Waived 300 10 - 300 mg/dL   Microalb/Creat Ratio 30-300 (H) <30 mg/g  Uric acid  Result Value Ref Range   Uric Acid 3.6 2.5 - 7.1 mg/dL  TSH  Result Value Ref Range   TSH 1.980 0.450 - 4.500 uIU/mL      Assessment & Plan:   Problem List Items Addressed This Visit    None    Visit Diagnoses    Immunization due    -  Primary    Relevant Orders    Flu Vaccine QUAD 36+ mos PF IM (Fluarix & Fluzone Quad PF) (Completed)    Annual physical exam            Follow up plan: Return in about 5 months (around 05/11/2015) for 30 min chronic disease.

## 2015-04-02 ENCOUNTER — Telehealth: Payer: Self-pay | Admitting: Unknown Physician Specialty

## 2015-04-02 NOTE — Telephone Encounter (Signed)
Pt would like a call back to discuss CW possibly writing a new rx for her since she is no longer seeing her other doctor in [place].

## 2015-04-02 NOTE — Telephone Encounter (Signed)
She will need to bring it in so we can look at it so I know the mgs of the components.  Or, call pharmacy and get more in formation and ask them if they can send [place] a refill request on the EMR

## 2015-04-02 NOTE — Telephone Encounter (Signed)
Called and spoke to patient. She states she got a liquid medication from her previous doctor in [facility] and she spelled it "q-dryl antacid lidocaine". States she only uses it as needed for mouth sores. Pharmacy is [facility] in [place].

## 2015-04-02 NOTE — Telephone Encounter (Signed)
Called pharmacy and asked about this medication. They stated that the patient called about it earlier and told them that she hasn't gotten it since 2014 and they told her that their system does not go back that far. So I am going to call the patient and see if she can bring the medication to [place] to look at.

## 2015-04-03 NOTE — Telephone Encounter (Signed)
Called and left patient a voicemail asking for her to please return my call.  

## 2015-04-03 NOTE — Telephone Encounter (Signed)
Called and spoke to patient. She is going to bring the medication by in the morning for [place] to take a look at.

## 2015-04-04 MED ORDER — DIPHENHYDRAMINE HCL 12.5 MG/5ML PO LIQD: ORAL | Status: DC

## 2015-04-04 NOTE — Addendum Note (Signed)
Addended by: Gabriel Cirri on: 04/04/2015 10:48 AM   Modules accepted: Orders

## 2015-04-04 NOTE — Telephone Encounter (Signed)
Patient brought medication in and I wrote down exactly what the bottle said. Will give to [facility] when she comes back in the office.

## 2015-04-29 ENCOUNTER — Other Ambulatory Visit: Payer: Self-pay | Admitting: Unknown Physician Specialty

## 2015-05-10 ENCOUNTER — Ambulatory Visit: Payer: Medicare Other | Admitting: Unknown Physician Specialty

## 2015-05-15 ENCOUNTER — Encounter: Payer: Self-pay | Admitting: Unknown Physician Specialty

## 2015-05-15 ENCOUNTER — Ambulatory Visit (INDEPENDENT_AMBULATORY_CARE_PROVIDER_SITE_OTHER): Payer: Medicare Other | Admitting: Unknown Physician Specialty

## 2015-05-15 ENCOUNTER — Other Ambulatory Visit: Payer: Self-pay | Admitting: Unknown Physician Specialty

## 2015-05-15 VITALS — BP 104/63 | HR 81 | Temp 98.5°F | Ht 63.3 in | Wt 178.8 lb

## 2015-05-15 DIAGNOSIS — H9202 Otalgia, left ear: Secondary | ICD-10-CM | POA: Diagnosis not present

## 2015-05-15 DIAGNOSIS — E1122 Type 2 diabetes mellitus with diabetic chronic kidney disease: Secondary | ICD-10-CM

## 2015-05-15 DIAGNOSIS — E785 Hyperlipidemia, unspecified: Secondary | ICD-10-CM

## 2015-05-15 DIAGNOSIS — IMO0001 Reserved for inherently not codable concepts without codable children: Secondary | ICD-10-CM

## 2015-05-15 DIAGNOSIS — M609 Myositis, unspecified: Secondary | ICD-10-CM | POA: Diagnosis not present

## 2015-05-15 DIAGNOSIS — J4540 Moderate persistent asthma, uncomplicated: Secondary | ICD-10-CM | POA: Diagnosis not present

## 2015-05-15 DIAGNOSIS — M791 Myalgia: Secondary | ICD-10-CM

## 2015-05-15 DIAGNOSIS — I10 Essential (primary) hypertension: Secondary | ICD-10-CM

## 2015-05-15 LAB — LIPID PANEL PICCOLO, WAIVED
Chol/HDL Ratio Piccolo,Waive: 2.9 mg/dL
Cholesterol Piccolo, Waived: 114 mg/dL (ref <200)
HDL Chol Piccolo, Waived: 39 mg/dL — ABNORMAL LOW (ref >59)
LDL Chol Calc Piccolo Waived: 58 mg/dL (ref <100)
Triglycerides Piccolo,Waived: 85 mg/dL (ref <150)
VLDL CHOL CALC PICCOLO,WAIVE: 17 mg/dL (ref <30)

## 2015-05-15 LAB — MICROALBUMIN, URINE WAIVED
CREATININE, URINE WAIVED: 200 mg/dL (ref 10–300)
MICROALB, UR WAIVED: 30 mg/L — AB (ref 0–19)

## 2015-05-15 LAB — BAYER DCA HB A1C WAIVED: HB A1C (BAYER DCA - WAIVED): 7.8 % — ABNORMAL HIGH (ref <7.0)

## 2015-05-15 MED ORDER — ATORVASTATIN CALCIUM 40 MG PO TABS: 40.0000 mg | ORAL_TABLET | Freq: Every day | ORAL | Status: AC

## 2015-05-15 MED ORDER — ADVAIR DISKUS 250-50 MCG/DOSE IN AEPB: 1.0000 | INHALATION_SPRAY | Freq: Two times a day (BID) | RESPIRATORY_TRACT | Status: AC

## 2015-05-15 MED ORDER — TRAZODONE HCL 50 MG PO TABS: 50.0000 mg | ORAL_TABLET | Freq: Every day | ORAL | Status: DC

## 2015-05-15 MED ORDER — ISOSORBIDE MONONITRATE ER 30 MG PO TB24: 30.0000 mg | ORAL_TABLET | Freq: Every day | ORAL | Status: DC

## 2015-05-15 MED ORDER — PROAIR HFA 108 (90 BASE) MCG/ACT IN AERS: 2.0000 | INHALATION_SPRAY | RESPIRATORY_TRACT | Status: AC | PRN

## 2015-05-15 MED ORDER — LISINOPRIL 5 MG PO TABS: 5.0000 mg | ORAL_TABLET | Freq: Every day | ORAL | Status: DC

## 2015-05-15 MED ORDER — METOPROLOL TARTRATE 25 MG PO TABS: 12.5000 mg | ORAL_TABLET | Freq: Two times a day (BID) | ORAL | Status: DC

## 2015-05-15 MED ORDER — METFORMIN HCL 1000 MG PO TABS: 1000.0000 mg | ORAL_TABLET | Freq: Two times a day (BID) | ORAL | Status: AC

## 2015-05-15 MED ORDER — BLOOD GLUCOSE MONITOR KIT
PACK | Status: AC
Start: 2015-05-15 — End: ?

## 2015-05-15 MED ORDER — DULAGLUTIDE 1.5 MG/0.5ML ~~LOC~~ SOAJ: 0.7500 mg | SUBCUTANEOUS | Status: AC

## 2015-05-15 NOTE — Assessment & Plan Note (Signed)
A1c was 7.8 today. Continue Metformin 1000mg  BID. Start Trulicity 0.75mg  weekly injection.

## 2015-05-15 NOTE — Assessment & Plan Note (Signed)
BP stable. Discontinue Inderal. Continue metoprolol.

## 2015-05-15 NOTE — Progress Notes (Signed)
o  BP 104/63 mmHg  Pulse 81  Temp(Src) 98.5 F (36.9 C)  Ht 5' 3.3" (1.608 m)  Wt 178 lb 12.8 oz (81.103 kg)  BMI 31.37 kg/m2  SpO2 95%  LMP  (LMP Unknown)   Subjective:    Patient ID: Shelby Butler, female    DOB: [birth year], 60 y.o.   MRN: [id number]  HPI: Shelby Butler is a 60 y.o. female  Chief Complaint  Patient presents with  . Diabetes  . Hyperlipidemia  . Hypertension  . Ear Pain    pt states she has been having some pain in her left ear  . Medication Refill    pt states she needs benadryl and baclofen refilled. Patient would like 90 days supplies of medications.    Diabetes:  Using medications without difficulties Hypoglycemic episodes: denies Hyperglycemic episodes: denies Feet problems: denies Blood Sugars averaging: doesn't take at home Eye exam within last year: No, last exam 07/2015 Last A1c: 6.7  Hypertension:    Using medication without problems No dizziness or lightheadedness  No chest pain with exertion No edema No shortness of breath Average home BPs: Doesn't take  Elevated Cholesterol: Using medications without problems Muscle aches constantly in neck, arms, and legs Diet compliance: Eats both at home and out. Occasional fast food. States she "pays no attention" and "easts what she wants" Exercise: Some at work walking around and lifting groceries (works part time as Conservation officer, [employer])  Ear pain: Left ear with itching and dull ache that started about 2 weeks ago. Hasn't tried anything to relieve.  Has congestion in the mornings and drainage when she lays down at night.   Myalgias/Muscle cramps: General all over muscle pains.Takes Gabapentin and Trazadone every night and has pain relief. Was taking Ultram as needed but ran out. Unsure as to when she ran out.   Asthma: Symptoms well controlled with Proair and Advair use and no issues using medication. No recent flare ups, wheezing, or SOB.      Relevant past medical, surgical, family and social  history reviewed and updated as indicated. Interim medical history since our last visit reviewed. Allergies and medications reviewed and updated.  Review of Systems  Constitutional: Negative.   HENT: Positive for ear pain.   Eyes: Negative.   Respiratory: Negative.   Cardiovascular: Negative.   Gastrointestinal: Negative.   Endocrine: Negative.   Genitourinary: Negative.   Musculoskeletal: Positive for myalgias.       Spasms "everywhere"  Skin: Negative.   Allergic/Immunologic: Negative.   Neurological: Negative.   Hematological: Negative.   Psychiatric/Behavioral: Negative.     Per HPI unless specifically indicated above     Objective:    BP 104/63 mmHg  Pulse 81  Temp(Src) 98.5 F (36.9 C)  Ht 5' 3.3" (1.608 m)  Wt 178 lb 12.8 oz (81.103 kg)  BMI 31.37 kg/m2  SpO2 95%  LMP  (LMP Unknown)  Wt Readings from Last 3 Encounters:  05/15/15 178 lb 12.8 oz (81.103 kg)  12/11/14 177 lb 3.2 oz (80.377 kg)  10/29/14 170 lb 3.2 oz (77.202 kg)    Physical Exam  Constitutional: She is oriented to person, place, and time. She appears well-developed and well-nourished. No distress.  HENT:  Head: Normocephalic and atraumatic.  Left Ear: A middle ear effusion is present.  Eyes: Conjunctivae and lids are normal. Right eye exhibits no discharge. Left eye exhibits no discharge. No scleral icterus.  Neck: Normal range of motion. Neck supple. No JVD present. Carotid  bruit is not present.  Cardiovascular: Normal rate, regular rhythm and normal heart sounds.   Pulmonary/Chest: Effort normal and breath sounds normal.  Abdominal: Normal appearance. There is no splenomegaly or hepatomegaly.  Musculoskeletal: Normal range of motion.  Neurological: She is alert and oriented to person, place, and time.  Skin: Skin is warm, dry and intact. No rash noted. No pallor.  Psychiatric: She has a normal mood and affect. Her behavior is normal. Judgment and thought content normal.         Assessment & Plan:   Problem List Items Addressed This Visit      Unprioritized   Essential (primary) hypertension    BP stable. Discontinue Inderal. Continue metoprolol.       Relevant Medications   propranolol (INDERAL) 10 MG tablet   atorvastatin (LIPITOR) 40 MG tablet   isosorbide mononitrate (IMDUR) 30 MG 24 hr tablet   lisinopril (PRINIVIL,ZESTRIL) 5 MG tablet   metoprolol tartrate (LOPRESSOR) 25 MG tablet   Other Relevant Orders   Microalbumin, Urine Waived (Completed)   Uric acid   Comprehensive metabolic panel   HLD (hyperlipidemia) - Primary    LDL was 58. Stable, continue present medications.       Relevant Medications   propranolol (INDERAL) 10 MG tablet   atorvastatin (LIPITOR) 40 MG tablet   isosorbide mononitrate (IMDUR) 30 MG 24 hr tablet   lisinopril (PRINIVIL,ZESTRIL) 5 MG tablet   metoprolol tartrate (LOPRESSOR) 25 MG tablet   Other Relevant Orders   Lipid Panel Piccolo, Waived (Completed)   Diabetes mellitus, type 2 (HCC)    A1c was 7.8 today. Continue Metformin 1000mg  BID. Start Trulicity 0.75mg  weekly injection.       Relevant Medications   Dulaglutide (TRULICITY) 1.5 MG/0.5ML SOPN   atorvastatin (LIPITOR) 40 MG tablet   lisinopril (PRINIVIL,ZESTRIL) 5 MG tablet   metFORMIN (GLUCOPHAGE) 1000 MG tablet   Other Relevant Orders   Bayer DCA Hb A1c Waived (Completed)   Ambulatory referral to diabetic education    Other Visit Diagnoses    Myalgia and myositis        [facility] and pain med presciptions managed there.     Relevant Orders    TSH    Vitamin B12    CBC    VITAMIN D 25 Hydroxy (Vit-D Deficiency, Fractures)    Asthma, moderate persistent, uncomplicated        Stable. Continue present medications.     Relevant Medications    ADVAIR DISKUS 250-50 MCG/DOSE AEPB    PROAIR HFA 108 (90 Base) MCG/ACT inhaler    Ear pain, left        Refer to ENT for serous changes and being a post surgical ear    Relevant Orders     Ambulatory referral to ENT        Follow up plan: Return in about 3 months (around 08/15/2015).

## 2015-05-15 NOTE — Assessment & Plan Note (Addendum)
LDL was 58. Stable, continue present medications.

## 2015-05-16 LAB — CBC
Hematocrit: 36.7 % (ref 34.0–46.6)
Hemoglobin: 11.9 g/dL (ref 11.1–15.9)
MCH: 26.4 pg — AB (ref 26.6–33.0)
MCHC: 32.4 g/dL (ref 31.5–35.7)
MCV: 81 fL (ref 79–97)
PLATELETS: 280 10*3/uL (ref 150–379)
RBC: 4.51 x10E6/uL (ref 3.77–5.28)
RDW: 16.2 % — ABNORMAL HIGH (ref 12.3–15.4)
WBC: 9.1 10*3/uL (ref 3.4–10.8)

## 2015-05-16 LAB — URIC ACID: Uric Acid: 2.9 mg/dL (ref 2.5–7.1)

## 2015-05-16 LAB — COMPREHENSIVE METABOLIC PANEL
ALBUMIN: 3.9 g/dL (ref 3.5–5.5)
ALK PHOS: 85 IU/L (ref 39–117)
ALT: 18 IU/L (ref 0–32)
AST: 15 IU/L (ref 0–40)
Albumin/Globulin Ratio: 1.6 (ref 1.1–2.5)
BILIRUBIN TOTAL: 0.3 mg/dL (ref 0.0–1.2)
BUN / CREAT RATIO: 34 — AB (ref 9–23)
BUN: 14 mg/dL (ref 6–24)
CO2: 24 mmol/L (ref 18–29)
Calcium: 8.7 mg/dL (ref 8.7–10.2)
Chloride: 99 mmol/L (ref 96–106)
Creatinine, Ser: 0.41 mg/dL — ABNORMAL LOW (ref 0.57–1.00)
GFR calc non Af Amer: 115 mL/min/{1.73_m2} (ref >59)
GFR, EST AFRICAN AMERICAN: 133 mL/min/{1.73_m2} (ref >59)
Globulin, Total: 2.5 g/dL (ref 1.5–4.5)
Glucose: 217 mg/dL — ABNORMAL HIGH (ref 65–99)
Potassium: 3.9 mmol/L (ref 3.5–5.2)
Sodium: 142 mmol/L (ref 134–144)
TOTAL PROTEIN: 6.4 g/dL (ref 6.0–8.5)

## 2015-05-16 LAB — VITAMIN D 25 HYDROXY (VIT D DEFICIENCY, FRACTURES): VIT D 25 HYDROXY: 27.6 ng/mL — AB (ref 30.0–100.0)

## 2015-05-16 LAB — VITAMIN B12: Vitamin B-12: 209 pg/mL — ABNORMAL LOW (ref 211–946)

## 2015-05-16 LAB — TSH: TSH: 2.36 u[IU]/mL (ref 0.450–4.500)

## 2015-05-17 ENCOUNTER — Other Ambulatory Visit: Payer: Self-pay | Admitting: Unknown Physician Specialty

## 2015-05-17 DIAGNOSIS — E538 Deficiency of other specified B group vitamins: Secondary | ICD-10-CM

## 2015-05-17 MED ORDER — CYANOCOBALAMIN 1000 MCG/ML IJ SOLN
1000.0000 ug | Freq: Once | INTRAMUSCULAR | Status: AC
  Administered 2015-05-20: 1000 ug via INTRAMUSCULAR

## 2015-05-17 MED ORDER — DIPHENHYDRAMINE HCL 12.5 MG/5ML PO LIQD: ORAL | Status: DC

## 2015-05-17 NOTE — Telephone Encounter (Signed)
Prescription refilled and faxed to pharmacy.

## 2015-05-17 NOTE — Telephone Encounter (Signed)
Pt called and stated that she was suppose to have a refill for benadryl sent to [facility] [place]

## 2015-05-17 NOTE — Telephone Encounter (Signed)
Routing to provider  

## 2015-05-17 NOTE — Telephone Encounter (Signed)
Discussed with patient low Vit B12 and Vit D.  She will get a B12 injection and will check back at f/u if it made a difference and consider more frequent injections vs oral supplementation.  Start 2,000 IUs of Vit D/day.

## 2015-05-20 ENCOUNTER — Ambulatory Visit (INDEPENDENT_AMBULATORY_CARE_PROVIDER_SITE_OTHER): Payer: Medicare Other

## 2015-05-20 ENCOUNTER — Telehealth: Payer: Self-pay

## 2015-05-20 DIAGNOSIS — E538 Deficiency of other specified B group vitamins: Secondary | ICD-10-CM

## 2015-05-20 NOTE — Telephone Encounter (Signed)
Gave patient her b12 injection today and she asked me if she was supposed to come back. I explained to her that I didn't see another injection record entered but I will check. After reviewing Cheryl's notes I explained to the patient we were going to recheck patient's b12 at her 3 month follow-up to see how the b12 shot did. She also explained that she wasn't sure how much VitaminD a day she should be taking but she was taking 1,000. I also explained to the patient that Elnita MaxwellCheryl advised 2,000 Units of Vit D/Day. I told patient if she had anymore questions, to give us call.

## 2015-06-02 ENCOUNTER — Other Ambulatory Visit: Payer: Self-pay | Admitting: Unknown Physician Specialty

## 2015-07-02 ENCOUNTER — Other Ambulatory Visit: Payer: Self-pay | Admitting: Unknown Physician Specialty

## 2015-07-15 ENCOUNTER — Encounter: Payer: Self-pay | Admitting: Unknown Physician Specialty

## 2015-07-15 DIAGNOSIS — E538 Deficiency of other specified B group vitamins: Secondary | ICD-10-CM | POA: Insufficient documentation

## 2015-07-15 DIAGNOSIS — Z5181 Encounter for therapeutic drug level monitoring: Secondary | ICD-10-CM | POA: Insufficient documentation

## 2015-07-15 DIAGNOSIS — E559 Vitamin D deficiency, unspecified: Secondary | ICD-10-CM | POA: Insufficient documentation

## 2015-08-20 ENCOUNTER — Ambulatory Visit: Payer: Medicare Other | Admitting: Unknown Physician Specialty

## 2016-09-04 ENCOUNTER — Telehealth: Payer: Self-pay | Admitting: Unknown Physician Specialty

## 2016-09-04 NOTE — Telephone Encounter (Signed)
Called pt to schedule Annual Wellness Visit with NHA  - knb  °

## 2016-09-30 ENCOUNTER — Telehealth: Payer: Self-pay | Admitting: Unknown Physician Specialty

## 2016-09-30 NOTE — Telephone Encounter (Signed)
Called pt to schedule Annual Wellness Visit with NHA  - knb  °

## 2016-10-13 ENCOUNTER — Telehealth: Payer: Self-pay | Admitting: Unknown Physician Specialty

## 2016-10-13 NOTE — Telephone Encounter (Signed)
Called pt to schedule for Annual Wellness Visit with Nurse Health Advisor, Tiffany Hill, my c/b # is [phone]  Kathryn Brown ° °

## 2017-04-15 ENCOUNTER — Other Ambulatory Visit: Payer: Self-pay | Admitting: Obstetrics and Gynecology

## 2017-04-15 DIAGNOSIS — Z1239 Encounter for other screening for malignant neoplasm of breast: Secondary | ICD-10-CM

## 2017-05-07 ENCOUNTER — Telehealth: Payer: Self-pay

## 2017-05-07 NOTE — Telephone Encounter (Signed)
Called to schedule medicare wellness [facility]. She states she no longer comes to this office, she has a PCP at [facility]. Updated care team.

## 2017-05-20 ENCOUNTER — Other Ambulatory Visit: Payer: Self-pay | Admitting: Obstetrics and Gynecology

## 2017-05-20 DIAGNOSIS — Z1239 Encounter for other screening for malignant neoplasm of breast: Secondary | ICD-10-CM

## 2017-06-10 ENCOUNTER — Ambulatory Visit
Admission: RE | Admit: 2017-06-10 | Discharge: 2017-06-10 | Disposition: A | Discharge status: Home / Self Care | Payer: Medicare Other | Source: Ambulatory Visit | Attending: Family Medicine | Admitting: Family Medicine

## 2017-06-10 DIAGNOSIS — Z1239 Encounter for other screening for malignant neoplasm of breast: Secondary | ICD-10-CM

## 2017-06-10 DIAGNOSIS — Z1231 Encounter for screening mammogram for malignant neoplasm of breast: Secondary | ICD-10-CM | POA: Diagnosis present

## 2017-11-19 DIAGNOSIS — Z789 Other specified health status: Secondary | ICD-10-CM | POA: Insufficient documentation

## 2018-10-09 DIAGNOSIS — I10 Essential (primary) hypertension: Secondary | ICD-10-CM | POA: Diagnosis not present

## 2018-10-09 DIAGNOSIS — R1031 Right lower quadrant pain: Secondary | ICD-10-CM | POA: Diagnosis not present

## 2018-10-09 DIAGNOSIS — R918 Other nonspecific abnormal finding of lung field: Secondary | ICD-10-CM | POA: Diagnosis not present

## 2018-10-09 DIAGNOSIS — R1011 Right upper quadrant pain: Secondary | ICD-10-CM | POA: Diagnosis not present

## 2018-10-09 DIAGNOSIS — Z7984 Long term (current) use of oral hypoglycemic drugs: Secondary | ICD-10-CM | POA: Diagnosis not present

## 2018-10-09 DIAGNOSIS — E119 Type 2 diabetes mellitus without complications: Secondary | ICD-10-CM | POA: Diagnosis not present

## 2018-10-09 DIAGNOSIS — R110 Nausea: Secondary | ICD-10-CM | POA: Diagnosis not present

## 2018-10-09 DIAGNOSIS — M545 Low back pain: Secondary | ICD-10-CM | POA: Diagnosis not present

## 2018-10-09 DIAGNOSIS — Z79899 Other long term (current) drug therapy: Secondary | ICD-10-CM | POA: Diagnosis not present

## 2018-10-09 DIAGNOSIS — Z9049 Acquired absence of other specified parts of digestive tract: Secondary | ICD-10-CM | POA: Diagnosis not present

## 2018-10-09 DIAGNOSIS — M549 Dorsalgia, unspecified: Secondary | ICD-10-CM | POA: Diagnosis not present

## 2018-10-09 DIAGNOSIS — K5730 Diverticulosis of large intestine without perforation or abscess without bleeding: Secondary | ICD-10-CM | POA: Diagnosis not present

## 2018-10-09 DIAGNOSIS — Z7982 Long term (current) use of aspirin: Secondary | ICD-10-CM | POA: Diagnosis not present

## 2018-10-09 DIAGNOSIS — Z9089 Acquired absence of other organs: Secondary | ICD-10-CM | POA: Diagnosis not present

## 2018-10-19 DIAGNOSIS — J4540 Moderate persistent asthma, uncomplicated: Secondary | ICD-10-CM | POA: Diagnosis not present

## 2018-10-19 DIAGNOSIS — E785 Hyperlipidemia, unspecified: Secondary | ICD-10-CM | POA: Diagnosis not present

## 2018-10-19 DIAGNOSIS — M546 Pain in thoracic spine: Secondary | ICD-10-CM | POA: Diagnosis not present

## 2018-10-19 DIAGNOSIS — J302 Other seasonal allergic rhinitis: Secondary | ICD-10-CM | POA: Diagnosis not present

## 2018-10-19 DIAGNOSIS — I10 Essential (primary) hypertension: Secondary | ICD-10-CM | POA: Diagnosis not present

## 2018-10-21 DIAGNOSIS — I10 Essential (primary) hypertension: Secondary | ICD-10-CM | POA: Diagnosis not present

## 2018-10-21 DIAGNOSIS — E1140 Type 2 diabetes mellitus with diabetic neuropathy, unspecified: Secondary | ICD-10-CM | POA: Diagnosis not present

## 2018-10-21 DIAGNOSIS — E785 Hyperlipidemia, unspecified: Secondary | ICD-10-CM | POA: Diagnosis not present

## 2018-10-21 DIAGNOSIS — Z1329 Encounter for screening for other suspected endocrine disorder: Secondary | ICD-10-CM | POA: Diagnosis not present

## 2018-10-21 DIAGNOSIS — Z1321 Encounter for screening for nutritional disorder: Secondary | ICD-10-CM | POA: Diagnosis not present

## 2018-10-26 DIAGNOSIS — M546 Pain in thoracic spine: Secondary | ICD-10-CM | POA: Diagnosis not present

## 2018-10-26 DIAGNOSIS — J302 Other seasonal allergic rhinitis: Secondary | ICD-10-CM | POA: Diagnosis not present

## 2018-10-26 DIAGNOSIS — E1140 Type 2 diabetes mellitus with diabetic neuropathy, unspecified: Secondary | ICD-10-CM | POA: Diagnosis not present

## 2018-10-26 DIAGNOSIS — J4540 Moderate persistent asthma, uncomplicated: Secondary | ICD-10-CM | POA: Diagnosis not present

## 2018-10-26 DIAGNOSIS — I10 Essential (primary) hypertension: Secondary | ICD-10-CM | POA: Diagnosis not present

## 2018-11-01 DIAGNOSIS — M545 Low back pain: Secondary | ICD-10-CM | POA: Diagnosis not present

## 2018-11-04 DIAGNOSIS — M545 Low back pain: Secondary | ICD-10-CM | POA: Diagnosis not present

## 2018-11-08 DIAGNOSIS — M545 Low back pain: Secondary | ICD-10-CM | POA: Diagnosis not present

## 2018-12-22 DIAGNOSIS — Z23 Encounter for immunization: Secondary | ICD-10-CM | POA: Diagnosis not present

## 2018-12-22 DIAGNOSIS — M546 Pain in thoracic spine: Secondary | ICD-10-CM | POA: Diagnosis not present

## 2018-12-22 DIAGNOSIS — I10 Essential (primary) hypertension: Secondary | ICD-10-CM | POA: Diagnosis not present

## 2019-01-26 DIAGNOSIS — E559 Vitamin D deficiency, unspecified: Secondary | ICD-10-CM | POA: Diagnosis not present

## 2019-01-26 DIAGNOSIS — I10 Essential (primary) hypertension: Secondary | ICD-10-CM | POA: Diagnosis not present

## 2019-01-26 DIAGNOSIS — E1140 Type 2 diabetes mellitus with diabetic neuropathy, unspecified: Secondary | ICD-10-CM | POA: Diagnosis not present

## 2019-01-26 DIAGNOSIS — E785 Hyperlipidemia, unspecified: Secondary | ICD-10-CM | POA: Diagnosis not present

## 2019-01-31 DIAGNOSIS — E559 Vitamin D deficiency, unspecified: Secondary | ICD-10-CM | POA: Diagnosis not present

## 2019-01-31 DIAGNOSIS — E1140 Type 2 diabetes mellitus with diabetic neuropathy, unspecified: Secondary | ICD-10-CM | POA: Diagnosis not present

## 2019-01-31 DIAGNOSIS — E785 Hyperlipidemia, unspecified: Secondary | ICD-10-CM | POA: Diagnosis not present

## 2019-01-31 DIAGNOSIS — I10 Essential (primary) hypertension: Secondary | ICD-10-CM | POA: Diagnosis not present

## 2019-01-31 DIAGNOSIS — J4540 Moderate persistent asthma, uncomplicated: Secondary | ICD-10-CM | POA: Diagnosis not present

## 2019-06-01 DIAGNOSIS — G4700 Insomnia, unspecified: Secondary | ICD-10-CM | POA: Diagnosis not present

## 2019-06-01 DIAGNOSIS — I10 Essential (primary) hypertension: Secondary | ICD-10-CM | POA: Diagnosis not present

## 2019-06-01 DIAGNOSIS — E785 Hyperlipidemia, unspecified: Secondary | ICD-10-CM | POA: Diagnosis not present

## 2019-06-01 DIAGNOSIS — E1140 Type 2 diabetes mellitus with diabetic neuropathy, unspecified: Secondary | ICD-10-CM | POA: Diagnosis not present

## 2019-06-01 DIAGNOSIS — E559 Vitamin D deficiency, unspecified: Secondary | ICD-10-CM | POA: Diagnosis not present

## 2019-06-04 ENCOUNTER — Ambulatory Visit: Payer: Medicare Other | Attending: Internal Medicine

## 2019-06-04 DIAGNOSIS — Z23 Encounter for immunization: Secondary | ICD-10-CM

## 2019-06-04 NOTE — Progress Notes (Signed)
   Covid-19 Vaccination Clinic  Name:  LURDES HALTIWANGER    MRN: [id number] DOB: 1955/10/30  06/04/2019  Ms. Maryellen Pile was observed post Covid-19 immunization for 15 minutes without incident. She was provided with Vaccine Information Sheet and instruction to access the V-Safe system.   Ms. Maryellen Pile was instructed to call 911 with any severe reactions post vaccine: Marland Kitchen Difficulty breathing  . Swelling of face and throat  . A fast heartbeat  . A bad rash all over body  . Dizziness and weakness   Immunizations Administered    Name Date Dose VIS Date Route   Pfizer COVID-19 Vaccine 06/04/2019 12:35 PM 0.3 mL 02/17/2019 Intramuscular   Manufacturer: [employer], [employer]   Lot: [id number]   NDC: [address]-6

## 2019-06-06 DIAGNOSIS — J302 Other seasonal allergic rhinitis: Secondary | ICD-10-CM | POA: Diagnosis not present

## 2019-06-06 DIAGNOSIS — Z0001 Encounter for general adult medical examination with abnormal findings: Secondary | ICD-10-CM | POA: Diagnosis not present

## 2019-06-16 DIAGNOSIS — S83242A Other tear of medial meniscus, current injury, left knee, initial encounter: Secondary | ICD-10-CM | POA: Diagnosis not present

## 2019-06-20 DIAGNOSIS — L820 Inflamed seborrheic keratosis: Secondary | ICD-10-CM | POA: Diagnosis not present

## 2019-06-20 DIAGNOSIS — X32XXXA Exposure to sunlight, initial encounter: Secondary | ICD-10-CM | POA: Diagnosis not present

## 2019-06-20 DIAGNOSIS — L570 Actinic keratosis: Secondary | ICD-10-CM | POA: Diagnosis not present

## 2019-06-27 ENCOUNTER — Ambulatory Visit: Payer: Medicare Other | Attending: Internal Medicine

## 2019-06-27 DIAGNOSIS — Z23 Encounter for immunization: Secondary | ICD-10-CM

## 2019-06-27 NOTE — Progress Notes (Signed)
   Covid-19 Vaccination Clinic  Name:  Shelby Butler    MRN: [id number] DOB: [birth year]  06/27/2019  Ms. Shelby Butler was observed post Covid-19 immunization for 15 minutes without incident. She was provided with Vaccine Information Sheet and instruction to access the V-Safe system.   Ms. Shelby Butler was instructed to call 911 with any severe reactions post vaccine: Marland Kitchen Difficulty breathing  . Swelling of face and throat  . A fast heartbeat  . A bad rash all over body  . Dizziness and weakness   Immunizations Administered    Name Date Dose VIS Date Route   Pfizer COVID-19 Vaccine 06/27/2019  1:47 PM 0.3 mL 05/03/2018 Intramuscular   Manufacturer: [employer], [employer]   Lot: [id number]   NDC: [address]-7

## 2019-06-28 DIAGNOSIS — M25562 Pain in left knee: Secondary | ICD-10-CM | POA: Diagnosis not present

## 2019-07-03 DIAGNOSIS — M1712 Unilateral primary osteoarthritis, left knee: Secondary | ICD-10-CM | POA: Diagnosis not present

## 2019-08-01 DIAGNOSIS — R1013 Epigastric pain: Secondary | ICD-10-CM | POA: Diagnosis not present

## 2019-08-01 DIAGNOSIS — M546 Pain in thoracic spine: Secondary | ICD-10-CM | POA: Diagnosis not present

## 2019-08-01 DIAGNOSIS — I10 Essential (primary) hypertension: Secondary | ICD-10-CM | POA: Diagnosis not present

## 2019-08-09 ENCOUNTER — Other Ambulatory Visit (HOSPITAL_COMMUNITY): Payer: Self-pay | Admitting: Internal Medicine

## 2019-08-09 ENCOUNTER — Other Ambulatory Visit: Payer: Self-pay | Admitting: Internal Medicine

## 2019-08-09 DIAGNOSIS — R1013 Epigastric pain: Secondary | ICD-10-CM

## 2019-08-09 DIAGNOSIS — Z1231 Encounter for screening mammogram for malignant neoplasm of breast: Secondary | ICD-10-CM

## 2019-08-18 ENCOUNTER — Other Ambulatory Visit: Payer: Self-pay

## 2019-08-18 ENCOUNTER — Ambulatory Visit
Admission: RE | Admit: 2019-08-18 | Discharge: 2019-08-18 | Disposition: A | Discharge status: Home / Self Care | Payer: Medicare Other | Source: Ambulatory Visit | Attending: Internal Medicine | Admitting: Internal Medicine

## 2019-08-18 DIAGNOSIS — R1013 Epigastric pain: Secondary | ICD-10-CM | POA: Insufficient documentation

## 2019-08-18 DIAGNOSIS — K760 Fatty (change of) liver, not elsewhere classified: Secondary | ICD-10-CM | POA: Diagnosis not present

## 2019-09-06 DIAGNOSIS — I10 Essential (primary) hypertension: Secondary | ICD-10-CM | POA: Diagnosis not present

## 2019-09-06 DIAGNOSIS — M546 Pain in thoracic spine: Secondary | ICD-10-CM | POA: Diagnosis not present

## 2019-09-06 DIAGNOSIS — R1013 Epigastric pain: Secondary | ICD-10-CM | POA: Diagnosis not present

## 2019-09-11 DIAGNOSIS — G4700 Insomnia, unspecified: Secondary | ICD-10-CM | POA: Diagnosis not present

## 2019-09-11 DIAGNOSIS — E1140 Type 2 diabetes mellitus with diabetic neuropathy, unspecified: Secondary | ICD-10-CM | POA: Diagnosis not present

## 2019-09-11 DIAGNOSIS — E785 Hyperlipidemia, unspecified: Secondary | ICD-10-CM | POA: Diagnosis not present

## 2019-09-11 DIAGNOSIS — E559 Vitamin D deficiency, unspecified: Secondary | ICD-10-CM | POA: Diagnosis not present

## 2019-09-13 DIAGNOSIS — E559 Vitamin D deficiency, unspecified: Secondary | ICD-10-CM | POA: Diagnosis not present

## 2019-09-13 DIAGNOSIS — J4540 Moderate persistent asthma, uncomplicated: Secondary | ICD-10-CM | POA: Diagnosis not present

## 2019-09-13 DIAGNOSIS — E1140 Type 2 diabetes mellitus with diabetic neuropathy, unspecified: Secondary | ICD-10-CM | POA: Diagnosis not present

## 2019-09-13 DIAGNOSIS — I10 Essential (primary) hypertension: Secondary | ICD-10-CM | POA: Diagnosis not present

## 2019-09-13 DIAGNOSIS — E785 Hyperlipidemia, unspecified: Secondary | ICD-10-CM | POA: Diagnosis not present

## 2019-09-25 ENCOUNTER — Other Ambulatory Visit: Payer: Self-pay

## 2019-09-26 ENCOUNTER — Ambulatory Visit
Admission: RE | Admit: 2019-09-26 | Discharge: 2019-09-26 | Disposition: A | Discharge status: Home / Self Care | Payer: Medicare Other | Source: Ambulatory Visit | Attending: Internal Medicine | Admitting: Internal Medicine

## 2019-09-26 DIAGNOSIS — Z1231 Encounter for screening mammogram for malignant neoplasm of breast: Secondary | ICD-10-CM | POA: Diagnosis not present

## 2019-10-20 ENCOUNTER — Other Ambulatory Visit: Payer: Self-pay

## 2019-10-23 ENCOUNTER — Ambulatory Visit: Payer: Medicare Other | Admitting: Gastroenterology

## 2019-10-23 ENCOUNTER — Encounter: Payer: Self-pay | Admitting: Gastroenterology

## 2019-10-23 ENCOUNTER — Other Ambulatory Visit: Payer: Self-pay

## 2019-10-23 VITALS — BP 134/78 | HR 78 | Temp 97.9°F | Wt 190.2 lb

## 2019-10-23 DIAGNOSIS — R1310 Dysphagia, unspecified: Secondary | ICD-10-CM

## 2019-10-23 DIAGNOSIS — R1319 Other dysphagia: Secondary | ICD-10-CM

## 2019-10-23 DIAGNOSIS — R1013 Epigastric pain: Secondary | ICD-10-CM

## 2019-10-23 NOTE — Progress Notes (Signed)
Shelby Butler, [place] [address]  Main: [phone]  Fax: [phone]   Gastroenterology Consultation  Referring Provider:     Celene Squibb, MD Primary Care Physician:  Celene Squibb, MD Reason for Consultation:   Abdominal pain        HPI:   Chief complaint: Abdominal pain  Shelby Butler is a 64 y.o. y/o female referred for consultation & management  by Dr. Nevada Crane, Edwinna Areola, MD.  Patient reports 63-monthhistory of epigastric and right upper quadrant abdominal pain, no radiation meals and nausea vomiting.  States 30 minutes after eating to have right upper quadrant abdominal which is associated with nausea and sometimes emesis.  No hematemesis.  Also reporting dysphagia that time,.  Solid food dysphagia, not liquids.  No food impactions.  [facility].  Benign colon polyp repeat recommended in 10 years.  EGD reported abnormal motility, decreased motility of the esophageal body, somewhat tortuous.  Distal esophagus/lower esophageal sphincter is open.  Normal mucosa in the esophagus.  Normal duodenum and stomach.  EGD with biopsies did not show eosinophilic esophagitis.  As per telephone note from September 18, 2014 "Also reviewed results of e mano which were normal, though this is a bit surprising given the endo findings and the sx..Marland KitchenMarland Kitcheny preference would be to monitor her and if she worsens then to repeat EGD w/ empiric dilation. "  Patient does states that at the time of this above EGD, she did have abdominal pain that was similar to what she is having now.  She has been asymptomatic from a dysphagia perspective since 2016 to now  Past Medical History:  Diagnosis Date  . Anxiety   . Chronic pain   . Depression   . Diabetes mellitus without complication ([place]   . Diverticula, colon   . Hyperlipidemia   . Insomnia   . RLS (restless legs syndrome)   . Scoliosis   . Sleep apnea    . Syncope     Past Surgical History:  Procedure Laterality Date  . ABDOMINAL HYSTERECTOMY    . APPENDECTOMY    . BREAST EXCISIONAL BIOPSY Right 2010?   benign  . KNEE SURGERY Left    x3    Prior to Admission medications   Medication Sig Start Date End Date Taking? Authorizing Provider  ADVAIR DISKUS 250-50 MCG/DOSE AEPB Inhale 1 puff into the lungs 2 (two) times daily. 05/15/15  Yes WKathrine Haddock NP  amLODipine (NORVASC) 5 MG tablet Take 5 mg by mouth at bedtime. 08/27/19  Yes [provider]  Ascorbic Acid (VITAMIN C) 1000 MG tablet Take 1,000 mg by mouth daily.   Yes [provider]  aspirin EC 81 MG tablet Take 81 mg by mouth daily.   Yes [provider]  atorvastatin (LIPITOR) 40 MG tablet Take 1 tablet (40 mg total) by mouth daily. 05/15/15  Yes WKathrine Haddock NP  Azelastine HCl 0.15 % SOLN Place 2 sprays into the nose in the morning and at bedtime. 08/10/18  Yes [provider]  blood glucose meter kit and supplies KIT Dispense based on patient and insurance preference. Use up to four times daily as directed. E11.9 05/15/15  Yes WKathrine Haddock NP  Cholecalciferol (VITAMIN D3) 10 MCG (400 UNIT) CAPS Take 1 capsule by mouth 1 day or 1 dose.   Yes [provider]  diazepam (VALIUM) 5 MG  tablet Take 1 tablet by mouth as needed.   Yes [provider]  Dulaglutide (TRULICITY) 1.5 AQ/7.6AU SOPN Inject 0.75 mg into the skin once a week. With needles please 05/15/15  Yes Kathrine Haddock, NP  fluticasone (FLONASE) 50 MCG/ACT nasal spray Place 2 sprays into both nostrils daily. 09/11/19  Yes [provider]  gabapentin (NEURONTIN) 800 MG tablet Take 1 tablet by mouth at bedtime. 09/18/14  Yes [provider]  glipiZIDE (GLUCOTROL) 10 MG tablet Take 10 mg by mouth 2 (two) times daily. 07/21/19  Yes [provider]  isosorbide mononitrate (IMDUR) 30 MG 24 hr tablet Take 1 tablet (30 mg total) by mouth daily. 05/15/15  Yes  Kathrine Haddock, NP  JARDIANCE 25 MG TABS tablet Take 25 mg by mouth daily. 08/30/19  Yes [provider]  lisinopril (ZESTRIL) 40 MG tablet Take 40 mg by mouth daily. 07/21/19  Yes [provider]  melatonin 5 MG TABS Take 5 mg by mouth.   Yes [provider]  meloxicam (MOBIC) 15 MG tablet Take 15 mg by mouth daily.   Yes [provider]  metFORMIN (GLUCOPHAGE) 1000 MG tablet Take 1 tablet (1,000 mg total) by mouth 2 (two) times daily with a meal. 05/15/15  Yes Kathrine Haddock, NP  methocarbamol (ROBAXIN) 500 MG tablet Take 1,000 mg by mouth every 6 (six) hours as needed. 08/31/19  Yes [provider]  metoprolol tartrate (LOPRESSOR) 25 MG tablet Take 0.5 tablets (12.5 mg total) by mouth 2 (two) times daily. 05/15/15  Yes Kathrine Haddock, NP  montelukast (SINGULAIR) 10 MG tablet Take 10 mg by mouth daily. 08/11/19  Yes [provider]  pantoprazole (PROTONIX) 40 MG tablet Take 40 mg by mouth daily. 08/01/19  Yes [provider]  pregabalin (LYRICA) 100 MG capsule Take 100 mg by mouth 2 (two) times daily.   Yes [provider]  PROAIR HFA 108 (90 Base) MCG/ACT inhaler Inhale 2 puffs into the lungs every 4 (four) hours as needed. 05/15/15  Yes Kathrine Haddock, NP  tiZANidine (ZANAFLEX) 4 MG tablet Take 3 tablets by mouth at bedtime. 08/09/18  Yes [provider]  traMADol (ULTRAM) 50 MG tablet Take 50 mg by mouth every 6 (six) hours as needed.   Yes [provider]  traZODone (DESYREL) 50 MG tablet Take 1 tablet (50 mg total) by mouth at bedtime. 05/15/15  Yes Kathrine Haddock, NP  baclofen (LIORESAL) 10 MG tablet Take 1 tablet by mouth 2 (two) times daily as needed. Patient not taking: Reported on 10/23/2019 09/24/14   [provider]  HYDROcodone-acetaminophen (NORCO) 7.5-325 MG tablet Take 1 tablet by mouth every 6 (six) hours as needed. Patient not taking: Reported on 10/23/2019 09/21/19   [provider]    naproxen (NAPROSYN) 500 MG tablet Take 1 tablet (500 mg total) by mouth 2 (two) times daily with a meal. Patient not taking: Reported on 10/23/2019 10/11/14   Lavonia Drafts, MD  SYMBICORT 160-4.5 MCG/ACT inhaler 1 puff 2 (two) times daily. Patient not taking: Reported on 10/23/2019 08/07/19   [provider]    Family History  Problem Relation Age of Onset  . Stroke Father   . Diabetes Sister      Social History   Tobacco Use  . Smoking status: Never Smoker  . Smokeless tobacco: Never Used  Substance Use Topics  . Alcohol use: Yes    Alcohol/week: 0.0 standard drinks    Comment: on occasion  . Drug use:  No    Allergies as of 10/23/2019  . (No Known Allergies)    Review of Systems:    All systems reviewed and negative except where noted in HPI.   Physical Exam:  BP 134/78   Pulse 78   Temp 97.9 F (36.6 C) (Oral)   Wt 190 lb 3.2 oz (86.3 kg)   LMP  (LMP Unknown)   BMI 33.37 kg/m  No LMP recorded (lmp unknown). Patient has had a hysterectomy. Psych:  Alert and cooperative. Normal mood and affect. General:   Alert,  Well-developed, well-nourished, pleasant and cooperative in NAD Head:  Normocephalic and atraumatic. Eyes:  Sclera clear, no icterus.   Conjunctiva pink. Ears:  Normal auditory acuity. Nose:  No deformity, discharge, or lesions. Mouth:  No deformity or lesions,oropharynx pink & moist. Neck:  Supple; no masses or thyromegaly. Abdomen:  Normal bowel sounds.  No bruits.  Soft, non-tender and non-distended without masses, hepatosplenomegaly or hernias noted.  No guarding or rebound tenderness.    Msk:  Symmetrical without gross deformities. Good, equal movement & strength bilaterally. Pulses:  Normal pulses noted. Extremities:  No clubbing or edema.  No cyanosis. Neurologic:  Alert and oriented x3;  grossly normal neurologically. Skin:  Intact without significant lesions or rashes. No jaundice. Lymph Nodes:  No significant cervical  adenopathy. Psych:  Alert and cooperative. Normal mood and affect.   Labs: CBC Recent labs scanned from July 2021 reviewed    Component Value Date/Time   WBC 9.1 05/15/2015 1434   WBC 7.3 03/03/2014 1339   RBC 4.51 05/15/2015 1434   RBC 4.68 03/03/2014 1339   HGB 11.9 05/15/2015 1434   HCT 36.7 05/15/2015 1434   PLT 280 05/15/2015 1434   MCV 81 05/15/2015 1434   MCV 85 03/03/2014 1339   MCH 26.4 (L) 05/15/2015 1434   MCH 27.8 03/03/2014 1339   MCHC 32.4 05/15/2015 1434   MCHC 32.8 03/03/2014 1339   RDW 16.2 (H) 05/15/2015 1434   RDW 14.2 03/03/2014 1339   CMP     Component Value Date/Time   NA 142 05/15/2015 1410   NA 141 03/03/2014 1339   K 3.9 05/15/2015 1410   K 3.9 03/03/2014 1339   CL 99 05/15/2015 1410   CL 105 03/03/2014 1339   CO2 24 05/15/2015 1410   CO2 29 03/03/2014 1339   GLUCOSE 217 (H) 05/15/2015 1410   GLUCOSE 165 (H) 03/03/2014 1339   BUN 14 05/15/2015 1410   BUN 12 03/03/2014 1339   CREATININE 0.41 (L) 05/15/2015 1410   CREATININE 0.66 03/03/2014 1339   CALCIUM 8.7 05/15/2015 1410   CALCIUM 8.5 03/03/2014 1339   PROT 6.4 05/15/2015 1410   ALBUMIN 3.9 05/15/2015 1410   AST 15 05/15/2015 1410   ALT 18 05/15/2015 1410   ALKPHOS 85 05/15/2015 1410   BILITOT 0.3 05/15/2015 1410   GFRNONAA 115 05/15/2015 1410   GFRNONAA >60 03/03/2014 1339   GFRAA 133 05/15/2015 1410   GFRAA >60 03/03/2014 1339    Imaging Studies: Recent ultrasound abdomen reviewed  Assessment and Plan:   KIMMBERLY WISSER is a 64 y.o. y/o female has been referred for abdominal pain  Patient's recent abdominal ultrasound shows a normal gallbladder  Recent labs from July 2021 shows normal liver enzymes  Given ongoing abdominal pain despite PPI prescribed by PCP, EGD indicated for further evaluation of dysphagia and abdominal pain.  Rule out ulcers, strictures, possible dilation.  I have discussed alternative options, risks & benefits,  which include, but are not limited to,  bleeding, infection, perforation,respiratory complication & drug reaction.  The patient agrees with this plan & written consent will be obtained.    If abdominal pain continues, consider functional gallbladder pain given above symptoms of biliary colic  Fatty liver work-up on next visit  Dr Shelby Antigua  Speech recognition software was used to dictate the above note.

## 2019-10-31 ENCOUNTER — Telehealth: Payer: Self-pay

## 2019-10-31 NOTE — Telephone Encounter (Signed)
Patient LVM to cancel her EGD scheduled with Dr. Maximino Greenland due to having to pay $325 up front before the procedure can be done. I returned her phone call and explained to her that she did not have to cancel, she could make payment arrangements.  She said she was not given the option to do so.  She said she will have to go back to [facility].  I asked if she was going to transfer her GI care and she said yes, because she just doesn't have the money to pay up front.  EGD has been canceled with Trish in Endoscopy.  Thanks,  [place], [place]

## 2019-11-03 DIAGNOSIS — E559 Vitamin D deficiency, unspecified: Secondary | ICD-10-CM | POA: Diagnosis not present

## 2019-11-03 DIAGNOSIS — J4540 Moderate persistent asthma, uncomplicated: Secondary | ICD-10-CM | POA: Diagnosis not present

## 2019-11-03 DIAGNOSIS — I10 Essential (primary) hypertension: Secondary | ICD-10-CM | POA: Diagnosis not present

## 2019-11-03 DIAGNOSIS — E1140 Type 2 diabetes mellitus with diabetic neuropathy, unspecified: Secondary | ICD-10-CM | POA: Diagnosis not present

## 2019-11-03 DIAGNOSIS — E785 Hyperlipidemia, unspecified: Secondary | ICD-10-CM | POA: Diagnosis not present

## 2019-11-08 ENCOUNTER — Ambulatory Visit: Admission: RE | Admit: 2019-11-08 | Payer: Medicare Other | Source: Home / Self Care | Admitting: Gastroenterology

## 2019-11-08 ENCOUNTER — Encounter: Admission: RE | Payer: Self-pay | Source: Home / Self Care

## 2019-11-08 SURGERY — ESOPHAGOGASTRODUODENOSCOPY (EGD) WITH PROPOFOL
Anesthesia: General

## 2019-11-17 ENCOUNTER — Telehealth: Payer: Self-pay

## 2019-11-17 NOTE — Telephone Encounter (Signed)
Patient called stating that she wanted to cancel her follow up appointment with Dr. Maximino Greenland for 11/21/2019 since she was not able to proceed with her EGD dur to cost. Patient did not want to reschedule since she stated that she was being seen at [facility]. Appointment will be cancelled per patient's request.

## 2019-11-21 ENCOUNTER — Encounter: Payer: Self-pay | Admitting: *Deleted

## 2019-11-21 ENCOUNTER — Other Ambulatory Visit: Payer: Self-pay

## 2019-11-21 ENCOUNTER — Telehealth: Payer: Self-pay

## 2019-11-21 ENCOUNTER — Telehealth: Payer: Medicare Other | Admitting: Gastroenterology

## 2019-11-21 NOTE — Telephone Encounter (Signed)
Called patient to go over her chart since it was a virtual visit and patient stated that she was not seeing Dr. Maximino Greenland any longer and would not want to follow up with her. I apologized for calling her. Patient was fine.

## 2019-11-30 DIAGNOSIS — R109 Unspecified abdominal pain: Secondary | ICD-10-CM | POA: Diagnosis not present

## 2019-11-30 DIAGNOSIS — R634 Abnormal weight loss: Secondary | ICD-10-CM | POA: Diagnosis not present

## 2019-12-07 DIAGNOSIS — I10 Essential (primary) hypertension: Secondary | ICD-10-CM | POA: Diagnosis not present

## 2019-12-07 DIAGNOSIS — E1140 Type 2 diabetes mellitus with diabetic neuropathy, unspecified: Secondary | ICD-10-CM | POA: Diagnosis not present

## 2019-12-07 DIAGNOSIS — J4540 Moderate persistent asthma, uncomplicated: Secondary | ICD-10-CM | POA: Diagnosis not present

## 2019-12-07 DIAGNOSIS — E785 Hyperlipidemia, unspecified: Secondary | ICD-10-CM | POA: Diagnosis not present

## 2019-12-07 DIAGNOSIS — E559 Vitamin D deficiency, unspecified: Secondary | ICD-10-CM | POA: Diagnosis not present

## 2019-12-21 ENCOUNTER — Emergency Department
Admission: EM | Admit: 2019-12-21 | Discharge: 2019-12-21 | Disposition: A | Discharge status: Home / Self Care | Payer: Medicare Other | Attending: Emergency Medicine | Admitting: Emergency Medicine

## 2019-12-21 ENCOUNTER — Other Ambulatory Visit: Payer: Self-pay

## 2019-12-21 ENCOUNTER — Emergency Department: Payer: Medicare Other

## 2019-12-21 DIAGNOSIS — Z8709 Personal history of other diseases of the respiratory system: Secondary | ICD-10-CM | POA: Diagnosis not present

## 2019-12-21 DIAGNOSIS — J45909 Unspecified asthma, uncomplicated: Secondary | ICD-10-CM | POA: Diagnosis not present

## 2019-12-21 DIAGNOSIS — Z5321 Procedure and treatment not carried out due to patient leaving prior to being seen by health care provider: Secondary | ICD-10-CM | POA: Insufficient documentation

## 2019-12-21 DIAGNOSIS — R0602 Shortness of breath: Secondary | ICD-10-CM | POA: Insufficient documentation

## 2019-12-21 DIAGNOSIS — Z20822 Contact with and (suspected) exposure to covid-19: Secondary | ICD-10-CM | POA: Insufficient documentation

## 2019-12-21 DIAGNOSIS — Z981 Arthrodesis status: Secondary | ICD-10-CM | POA: Diagnosis not present

## 2019-12-21 DIAGNOSIS — R079 Chest pain, unspecified: Secondary | ICD-10-CM | POA: Insufficient documentation

## 2019-12-21 DIAGNOSIS — R0981 Nasal congestion: Secondary | ICD-10-CM | POA: Diagnosis not present

## 2019-12-21 LAB — COMPREHENSIVE METABOLIC PANEL
ALT: 33 U/L (ref 0–44)
AST: 28 U/L (ref 15–41)
Albumin: 4 g/dL (ref 3.5–5.0)
Alkaline Phosphatase: 86 U/L (ref 38–126)
Anion gap: 12 (ref 5–15)
BUN: 30 mg/dL — ABNORMAL HIGH (ref 8–23)
CO2: 23 mmol/L (ref 22–32)
Calcium: 8.8 mg/dL — ABNORMAL LOW (ref 8.9–10.3)
Chloride: 105 mmol/L (ref 98–111)
Creatinine, Ser: 0.74 mg/dL (ref 0.44–1.00)
GFR, Estimated: >60 mL/min (ref >60)
Glucose, Bld: 71 mg/dL (ref 70–99)
Potassium: 3.6 mmol/L (ref 3.5–5.1)
Sodium: 140 mmol/L (ref 135–145)
Total Bilirubin: 0.9 mg/dL (ref 0.3–1.2)
Total Protein: 7.7 g/dL (ref 6.5–8.1)

## 2019-12-21 LAB — RESPIRATORY PANEL BY RT PCR (FLU A&B, COVID)
Influenza A by PCR: NEGATIVE
Influenza B by PCR: NEGATIVE
SARS Coronavirus 2 by RT PCR: POSITIVE — AB

## 2019-12-21 LAB — CBC
HCT: 39.6 % (ref 36.0–46.0)
Hemoglobin: 12.8 g/dL (ref 12.0–15.0)
MCH: 26.3 pg (ref 26.0–34.0)
MCHC: 32.3 g/dL (ref 30.0–36.0)
MCV: 81.5 fL (ref 80.0–100.0)
Platelets: 193 10*3/uL (ref 150–400)
RBC: 4.86 MIL/uL (ref 3.87–5.11)
RDW: 15 % (ref 11.5–15.5)
WBC: 7.3 10*3/uL (ref 4.0–10.5)
nRBC: 0 % (ref 0.0–0.2)

## 2019-12-21 LAB — TROPONIN I (HIGH SENSITIVITY): Troponin I (High Sensitivity): 7 ng/L (ref <18)

## 2019-12-21 NOTE — ED Triage Notes (Signed)
Pt in with co cold symptoms, runny nose, congestion, shortness of breath and chest pain. Unsure of fever, does have a hx of asthma but has not used inhaler. Pt able to speak in complete sentences at this time.

## 2019-12-22 ENCOUNTER — Telehealth: Payer: Self-pay | Admitting: Emergency Medicine

## 2019-12-22 ENCOUNTER — Telehealth: Payer: Self-pay | Admitting: Family

## 2019-12-22 NOTE — Telephone Encounter (Signed)
Called to Discuss with patient about Covid symptoms and the use of the monoclonal antibody infusion for those with mild to moderate Covid symptoms and at a high risk of hospitalization.     Pt appears to qualify for this infusion due to co-morbid conditions and/or a member of an at-risk group in accordance with the FDA Emergency Use Authorization.    Shelby Butler went to the [facility]  ED on 10/15 with runny nose, congestion, shortness of breath and chest pain. COVID testing was positive. Qualifying risk factors include hypertension, diabetes, sleep apnea, and BMI > 25 (33).   I attempted to speak with Shelby Butler and was unable to get in contact. A generic voicemail was left that included the hotline number. MyChart message will also be sent.   Marcos Eke, NP 12/22/2019 2:43 PM

## 2019-12-22 NOTE — Telephone Encounter (Signed)
Called patient due to lwot to inquire about condition and follow up plans. Left message.   

## 2019-12-23 ENCOUNTER — Other Ambulatory Visit: Payer: Self-pay | Admitting: Family

## 2019-12-23 DIAGNOSIS — U071 COVID-19: Secondary | ICD-10-CM

## 2019-12-23 DIAGNOSIS — E1122 Type 2 diabetes mellitus with diabetic chronic kidney disease: Secondary | ICD-10-CM

## 2019-12-23 DIAGNOSIS — G4733 Obstructive sleep apnea (adult) (pediatric): Secondary | ICD-10-CM

## 2019-12-23 DIAGNOSIS — I10 Essential (primary) hypertension: Secondary | ICD-10-CM

## 2019-12-23 NOTE — Progress Notes (Signed)
I connected by phone with Shelby Butler on 12/23/2019 at 1:15 PM to discuss the potential use of a new treatment for mild to moderate COVID-19 viral infection in non-hospitalized patients.  This patient is a 64 y.o. female that meets the FDA criteria for Emergency Use Authorization of COVID monoclonal antibody casirivimab/imdevimab or bamlanivimab/eteseviamb.  Has a (+) direct SARS-CoV-2 viral test result  Has mild or moderate COVID-19   Is NOT hospitalized due to COVID-19  Is within 10 days of symptom onset  Has at least one of the high risk factor(s) for progression to severe COVID-19 and/or hospitalization as defined in EUA.  Specific high risk criteria : BMI > 25, Diabetes, Cardiovascular disease or hypertension and Chronic Lung Disease   I have spoken and communicated the following to the patient or parent/caregiver regarding COVID monoclonal antibody treatment:  1. FDA has authorized the emergency use for the treatment of mild to moderate COVID-19 in adults and pediatric patients with positive results of direct SARS-CoV-2 viral testing who are 12 years of age and older weighing at least 40 kg, and who are at high risk for progressing to severe COVID-19 and/or hospitalization.  2. The significant known and potential risks and benefits of COVID monoclonal antibody, and the extent to which such potential risks and benefits are unknown.  3. Information on available alternative treatments and the risks and benefits of those alternatives, including clinical trials.  4. Patients treated with COVID monoclonal antibody should continue to self-isolate and use infection control measures (e.g., wear mask, isolate, social distance, avoid sharing personal items, clean and disinfect "high touch" surfaces, and frequent handwashing) according to CDC guidelines.   5. The patient or parent/caregiver has the option to accept or refuse COVID monoclonal antibody treatment.  After reviewing this  information with the patient, the patient has agreed to receive one of the available covid 19 monoclonal antibodies and will be provided an appropriate fact sheet prior to infusion.   Shelby Luz, FNP 12/23/2019 1:15 PM

## 2019-12-23 NOTE — Telephone Encounter (Signed)
Continues to nausea, congestion, and loss of smell/taste. Symptoms started on 12/16/19. Qualifying risk factors as previously noted include hypertension, diabetes, sleep apnea, and BMI > 25 (33).   Spoke with [name] regarding the risks, benefits, and potential financial costs associated with treatment with monoclonal antibodies.  She wishes to continue with treatment at this time.  Hello Shelby Butler,   You have been scheduled to receive the monoclonal antibody therapy at [facility]: 12/25/19 at 8:30 am   If you have been tested outside of a [facility] - you MUST bring a copy of your positive test with you the morning of your appointment. You may take a photo of this and upload to your MyChart portal or have the testing facility fax the result to [phone]    The address for the infusion clinic site is:  --GPS address is [address] - the parking is located near [employer] building where you will see  COVID19 Infusion feather banner marking the entrance to parking.   (see photos below)            --Enter into the 2nd entrance where the "wave, flag banner" is at the road. Turn into this 2nd entrance and immediately turn left to park in 1 of the 5 parking spots.   --Please stay in your car and call the desk for assistance inside [phone].   --Average time in department is roughly 2 hours for Regeneron treatment - this includes preparation of the medication, IV start and the required 1 hour monitoring after the infusion.    Should you develop worsening shortness of breath, chest pain or severe breathing problems please do not wait for this appointment and go to the Emergency room for evaluation and treatment. You will undergo another oxygen screen before your infusion to ensure this is the best treatment option for you. There is a chance that the best decision may be to send you to the Emergency Room for evaluation at the time of your appointment.   The day  of your visit you should: Marland Kitchen Get plenty of rest the night before and drink plenty of water . Eat a light meal/snack before coming and take your medications as prescribed  . Wear warm, comfortable clothes with a shirt that can roll-up over the elbow (will need IV start).  . Wear a mask  . Consider bringing some activity to help pass the time  Many commercial insurers are waiving bills related to COVID treatment however some have ranged from $300-640. We are starting to see some insurers send bills to patients later for the administration of the medication - we are learning more information but you may receive a bill after your appointment.  Please contact your insurance agent to discuss prior to your appointment if you would like further details about billing specific to your policy.    The CPT code is [phone] for your reference.    I hope this helps find you feeling better,  Shelby Butler

## 2019-12-25 ENCOUNTER — Ambulatory Visit (HOSPITAL_COMMUNITY): Payer: Medicare Other

## 2019-12-26 DIAGNOSIS — U071 COVID-19: Secondary | ICD-10-CM | POA: Diagnosis not present

## 2020-01-05 DIAGNOSIS — J4540 Moderate persistent asthma, uncomplicated: Secondary | ICD-10-CM | POA: Diagnosis not present

## 2020-01-05 DIAGNOSIS — E559 Vitamin D deficiency, unspecified: Secondary | ICD-10-CM | POA: Diagnosis not present

## 2020-01-05 DIAGNOSIS — I10 Essential (primary) hypertension: Secondary | ICD-10-CM | POA: Diagnosis not present

## 2020-01-05 DIAGNOSIS — E1140 Type 2 diabetes mellitus with diabetic neuropathy, unspecified: Secondary | ICD-10-CM | POA: Diagnosis not present

## 2020-01-05 DIAGNOSIS — E785 Hyperlipidemia, unspecified: Secondary | ICD-10-CM | POA: Diagnosis not present

## 2020-01-18 DIAGNOSIS — R1013 Epigastric pain: Secondary | ICD-10-CM | POA: Diagnosis not present

## 2020-01-18 DIAGNOSIS — Z712 Person consulting for explanation of examination or test findings: Secondary | ICD-10-CM | POA: Diagnosis not present

## 2020-01-18 DIAGNOSIS — B372 Candidiasis of skin and nail: Secondary | ICD-10-CM | POA: Diagnosis not present

## 2020-01-18 DIAGNOSIS — R519 Headache, unspecified: Secondary | ICD-10-CM | POA: Diagnosis not present

## 2020-01-18 DIAGNOSIS — E1140 Type 2 diabetes mellitus with diabetic neuropathy, unspecified: Secondary | ICD-10-CM | POA: Diagnosis not present

## 2020-01-24 DIAGNOSIS — I10 Essential (primary) hypertension: Secondary | ICD-10-CM | POA: Diagnosis not present

## 2020-01-24 DIAGNOSIS — G8929 Other chronic pain: Secondary | ICD-10-CM | POA: Diagnosis not present

## 2020-01-24 DIAGNOSIS — Z7951 Long term (current) use of inhaled steroids: Secondary | ICD-10-CM | POA: Diagnosis not present

## 2020-01-24 DIAGNOSIS — E785 Hyperlipidemia, unspecified: Secondary | ICD-10-CM | POA: Diagnosis not present

## 2020-01-24 DIAGNOSIS — G44229 Chronic tension-type headache, not intractable: Secondary | ICD-10-CM | POA: Diagnosis not present

## 2020-01-24 DIAGNOSIS — G930 Cerebral cysts: Secondary | ICD-10-CM | POA: Diagnosis not present

## 2020-01-24 DIAGNOSIS — G4733 Obstructive sleep apnea (adult) (pediatric): Secondary | ICD-10-CM | POA: Diagnosis not present

## 2020-01-24 DIAGNOSIS — Z7984 Long term (current) use of oral hypoglycemic drugs: Secondary | ICD-10-CM | POA: Diagnosis not present

## 2020-01-24 DIAGNOSIS — Z79891 Long term (current) use of opiate analgesic: Secondary | ICD-10-CM | POA: Diagnosis not present

## 2020-01-24 DIAGNOSIS — G250 Essential tremor: Secondary | ICD-10-CM | POA: Diagnosis not present

## 2020-01-24 DIAGNOSIS — Z79899 Other long term (current) drug therapy: Secondary | ICD-10-CM | POA: Diagnosis not present

## 2020-01-24 DIAGNOSIS — M4800 Spinal stenosis, site unspecified: Secondary | ICD-10-CM | POA: Diagnosis not present

## 2020-01-24 DIAGNOSIS — Z7982 Long term (current) use of aspirin: Secondary | ICD-10-CM | POA: Diagnosis not present

## 2020-01-24 DIAGNOSIS — H9190 Unspecified hearing loss, unspecified ear: Secondary | ICD-10-CM | POA: Diagnosis not present

## 2020-01-24 DIAGNOSIS — M542 Cervicalgia: Secondary | ICD-10-CM | POA: Diagnosis not present

## 2020-01-24 DIAGNOSIS — G44209 Tension-type headache, unspecified, not intractable: Secondary | ICD-10-CM | POA: Diagnosis not present

## 2020-01-24 DIAGNOSIS — E119 Type 2 diabetes mellitus without complications: Secondary | ICD-10-CM | POA: Diagnosis not present

## 2020-01-24 DIAGNOSIS — Z9119 Patient's noncompliance with other medical treatment and regimen: Secondary | ICD-10-CM | POA: Diagnosis not present

## 2020-02-07 DIAGNOSIS — R1013 Epigastric pain: Secondary | ICD-10-CM | POA: Diagnosis not present

## 2020-02-07 DIAGNOSIS — E559 Vitamin D deficiency, unspecified: Secondary | ICD-10-CM | POA: Diagnosis not present

## 2020-02-07 DIAGNOSIS — R519 Headache, unspecified: Secondary | ICD-10-CM | POA: Diagnosis not present

## 2020-02-07 DIAGNOSIS — E1140 Type 2 diabetes mellitus with diabetic neuropathy, unspecified: Secondary | ICD-10-CM | POA: Diagnosis not present

## 2020-02-07 DIAGNOSIS — Z712 Person consulting for explanation of examination or test findings: Secondary | ICD-10-CM | POA: Diagnosis not present

## 2020-02-12 DIAGNOSIS — E785 Hyperlipidemia, unspecified: Secondary | ICD-10-CM | POA: Diagnosis not present

## 2020-02-12 DIAGNOSIS — I10 Essential (primary) hypertension: Secondary | ICD-10-CM | POA: Diagnosis not present

## 2020-02-12 DIAGNOSIS — E559 Vitamin D deficiency, unspecified: Secondary | ICD-10-CM | POA: Diagnosis not present

## 2020-02-12 DIAGNOSIS — E1140 Type 2 diabetes mellitus with diabetic neuropathy, unspecified: Secondary | ICD-10-CM | POA: Diagnosis not present

## 2020-02-12 DIAGNOSIS — J4540 Moderate persistent asthma, uncomplicated: Secondary | ICD-10-CM | POA: Diagnosis not present

## 2020-02-16 DIAGNOSIS — Z23 Encounter for immunization: Secondary | ICD-10-CM | POA: Diagnosis not present

## 2020-03-06 DIAGNOSIS — R110 Nausea: Secondary | ICD-10-CM | POA: Diagnosis not present

## 2020-03-08 DIAGNOSIS — I10 Essential (primary) hypertension: Secondary | ICD-10-CM | POA: Diagnosis not present

## 2020-03-08 DIAGNOSIS — E559 Vitamin D deficiency, unspecified: Secondary | ICD-10-CM | POA: Diagnosis not present

## 2020-03-08 DIAGNOSIS — J4540 Moderate persistent asthma, uncomplicated: Secondary | ICD-10-CM | POA: Diagnosis not present

## 2020-03-08 DIAGNOSIS — E1140 Type 2 diabetes mellitus with diabetic neuropathy, unspecified: Secondary | ICD-10-CM | POA: Diagnosis not present

## 2020-03-08 DIAGNOSIS — E785 Hyperlipidemia, unspecified: Secondary | ICD-10-CM | POA: Diagnosis not present

## 2020-03-25 DIAGNOSIS — J019 Acute sinusitis, unspecified: Secondary | ICD-10-CM | POA: Diagnosis not present

## 2020-05-21 DIAGNOSIS — I10 Essential (primary) hypertension: Secondary | ICD-10-CM | POA: Diagnosis not present

## 2020-05-21 DIAGNOSIS — E559 Vitamin D deficiency, unspecified: Secondary | ICD-10-CM | POA: Diagnosis not present

## 2020-05-21 DIAGNOSIS — Z0001 Encounter for general adult medical examination with abnormal findings: Secondary | ICD-10-CM | POA: Diagnosis not present

## 2020-05-21 DIAGNOSIS — E1140 Type 2 diabetes mellitus with diabetic neuropathy, unspecified: Secondary | ICD-10-CM | POA: Diagnosis not present

## 2020-05-21 DIAGNOSIS — E785 Hyperlipidemia, unspecified: Secondary | ICD-10-CM | POA: Diagnosis not present

## 2020-05-21 DIAGNOSIS — Z712 Person consulting for explanation of examination or test findings: Secondary | ICD-10-CM | POA: Diagnosis not present

## 2020-05-21 DIAGNOSIS — Z23 Encounter for immunization: Secondary | ICD-10-CM | POA: Diagnosis not present

## 2020-05-28 DIAGNOSIS — E1140 Type 2 diabetes mellitus with diabetic neuropathy, unspecified: Secondary | ICD-10-CM | POA: Diagnosis not present

## 2020-05-28 DIAGNOSIS — E785 Hyperlipidemia, unspecified: Secondary | ICD-10-CM | POA: Diagnosis not present

## 2020-05-28 DIAGNOSIS — M546 Pain in thoracic spine: Secondary | ICD-10-CM | POA: Diagnosis not present

## 2020-05-28 DIAGNOSIS — J302 Other seasonal allergic rhinitis: Secondary | ICD-10-CM | POA: Diagnosis not present

## 2020-05-28 DIAGNOSIS — E559 Vitamin D deficiency, unspecified: Secondary | ICD-10-CM | POA: Diagnosis not present

## 2020-05-28 DIAGNOSIS — I10 Essential (primary) hypertension: Secondary | ICD-10-CM | POA: Diagnosis not present

## 2020-05-28 DIAGNOSIS — M25511 Pain in right shoulder: Secondary | ICD-10-CM | POA: Diagnosis not present

## 2020-05-28 DIAGNOSIS — G4700 Insomnia, unspecified: Secondary | ICD-10-CM | POA: Diagnosis not present

## 2020-05-28 DIAGNOSIS — J4540 Moderate persistent asthma, uncomplicated: Secondary | ICD-10-CM | POA: Diagnosis not present

## 2020-06-05 DIAGNOSIS — E559 Vitamin D deficiency, unspecified: Secondary | ICD-10-CM | POA: Diagnosis not present

## 2020-06-05 DIAGNOSIS — M25511 Pain in right shoulder: Secondary | ICD-10-CM | POA: Diagnosis not present

## 2020-06-05 DIAGNOSIS — M546 Pain in thoracic spine: Secondary | ICD-10-CM | POA: Diagnosis not present

## 2020-06-05 DIAGNOSIS — R519 Headache, unspecified: Secondary | ICD-10-CM | POA: Diagnosis not present

## 2020-06-05 DIAGNOSIS — J302 Other seasonal allergic rhinitis: Secondary | ICD-10-CM | POA: Diagnosis not present

## 2020-06-05 DIAGNOSIS — E1140 Type 2 diabetes mellitus with diabetic neuropathy, unspecified: Secondary | ICD-10-CM | POA: Diagnosis not present

## 2020-06-05 DIAGNOSIS — G4700 Insomnia, unspecified: Secondary | ICD-10-CM | POA: Diagnosis not present

## 2020-06-05 DIAGNOSIS — E785 Hyperlipidemia, unspecified: Secondary | ICD-10-CM | POA: Diagnosis not present

## 2020-06-05 DIAGNOSIS — I10 Essential (primary) hypertension: Secondary | ICD-10-CM | POA: Diagnosis not present

## 2020-06-13 ENCOUNTER — Other Ambulatory Visit: Payer: Self-pay

## 2020-06-13 ENCOUNTER — Observation Stay
Admission: EM | Admit: 2020-06-13 | Discharge: 2020-06-14 | Disposition: A | Discharge status: Home / Self Care | Payer: Medicare HMO | Attending: Internal Medicine | Admitting: Internal Medicine

## 2020-06-13 ENCOUNTER — Emergency Department: Payer: Medicare HMO

## 2020-06-13 DIAGNOSIS — J45909 Unspecified asthma, uncomplicated: Secondary | ICD-10-CM | POA: Insufficient documentation

## 2020-06-13 DIAGNOSIS — N179 Acute kidney failure, unspecified: Secondary | ICD-10-CM | POA: Diagnosis not present

## 2020-06-13 DIAGNOSIS — K529 Noninfective gastroenteritis and colitis, unspecified: Secondary | ICD-10-CM | POA: Diagnosis not present

## 2020-06-13 DIAGNOSIS — R197 Diarrhea, unspecified: Secondary | ICD-10-CM | POA: Diagnosis not present

## 2020-06-13 DIAGNOSIS — Z79899 Other long term (current) drug therapy: Secondary | ICD-10-CM

## 2020-06-13 DIAGNOSIS — I10 Essential (primary) hypertension: Secondary | ICD-10-CM | POA: Diagnosis present

## 2020-06-13 DIAGNOSIS — R112 Nausea with vomiting, unspecified: Secondary | ICD-10-CM | POA: Diagnosis not present

## 2020-06-13 DIAGNOSIS — Z7984 Long term (current) use of oral hypoglycemic drugs: Secondary | ICD-10-CM | POA: Diagnosis not present

## 2020-06-13 DIAGNOSIS — R1111 Vomiting without nausea: Secondary | ICD-10-CM | POA: Diagnosis not present

## 2020-06-13 DIAGNOSIS — R531 Weakness: Secondary | ICD-10-CM | POA: Diagnosis present

## 2020-06-13 DIAGNOSIS — E1129 Type 2 diabetes mellitus with other diabetic kidney complication: Secondary | ICD-10-CM

## 2020-06-13 DIAGNOSIS — R1110 Vomiting, unspecified: Secondary | ICD-10-CM | POA: Diagnosis not present

## 2020-06-13 DIAGNOSIS — E119 Type 2 diabetes mellitus without complications: Secondary | ICD-10-CM | POA: Diagnosis not present

## 2020-06-13 DIAGNOSIS — Z20822 Contact with and (suspected) exposure to covid-19: Secondary | ICD-10-CM | POA: Insufficient documentation

## 2020-06-13 DIAGNOSIS — E860 Dehydration: Secondary | ICD-10-CM

## 2020-06-13 DIAGNOSIS — Z7982 Long term (current) use of aspirin: Secondary | ICD-10-CM | POA: Diagnosis not present

## 2020-06-13 DIAGNOSIS — I959 Hypotension, unspecified: Secondary | ICD-10-CM | POA: Diagnosis not present

## 2020-06-13 DIAGNOSIS — I9589 Other hypotension: Secondary | ICD-10-CM | POA: Diagnosis not present

## 2020-06-13 DIAGNOSIS — G8929 Other chronic pain: Secondary | ICD-10-CM | POA: Diagnosis present

## 2020-06-13 DIAGNOSIS — J449 Chronic obstructive pulmonary disease, unspecified: Secondary | ICD-10-CM | POA: Diagnosis not present

## 2020-06-13 DIAGNOSIS — J4530 Mild persistent asthma, uncomplicated: Secondary | ICD-10-CM | POA: Diagnosis present

## 2020-06-13 DIAGNOSIS — R110 Nausea: Secondary | ICD-10-CM | POA: Diagnosis not present

## 2020-06-13 DIAGNOSIS — J80 Acute respiratory distress syndrome: Secondary | ICD-10-CM | POA: Diagnosis not present

## 2020-06-13 DIAGNOSIS — E861 Hypovolemia: Secondary | ICD-10-CM

## 2020-06-13 LAB — CBC WITH DIFFERENTIAL/PLATELET
Abs Immature Granulocytes: 0.05 10*3/uL (ref 0.00–0.07)
Basophils Absolute: 0 10*3/uL (ref 0.0–0.1)
Basophils Relative: 0 %
Eosinophils Absolute: 0.1 10*3/uL (ref 0.0–0.5)
Eosinophils Relative: 2 %
HCT: 35 % — ABNORMAL LOW (ref 36.0–46.0)
Hemoglobin: 11.2 g/dL — ABNORMAL LOW (ref 12.0–15.0)
Immature Granulocytes: 1 %
Lymphocytes Relative: 24 %
Lymphs Abs: 1.5 10*3/uL (ref 0.7–4.0)
MCH: 26.9 pg (ref 26.0–34.0)
MCHC: 32 g/dL (ref 30.0–36.0)
MCV: 84.1 fL (ref 80.0–100.0)
Monocytes Absolute: 0.7 10*3/uL (ref 0.1–1.0)
Monocytes Relative: 12 %
Neutro Abs: 3.8 10*3/uL (ref 1.7–7.7)
Neutrophils Relative %: 61 %
Platelets: 214 10*3/uL (ref 150–400)
RBC: 4.16 MIL/uL (ref 3.87–5.11)
RDW: 14.9 % (ref 11.5–15.5)
WBC: 6.3 10*3/uL (ref 4.0–10.5)
nRBC: 0 % (ref 0.0–0.2)

## 2020-06-13 LAB — BASIC METABOLIC PANEL
Anion gap: 8 (ref 5–15)
BUN: 38 mg/dL — ABNORMAL HIGH (ref 8–23)
CO2: 23 mmol/L (ref 22–32)
Calcium: 7.8 mg/dL — ABNORMAL LOW (ref 8.9–10.3)
Chloride: 107 mmol/L (ref 98–111)
Creatinine, Ser: 1.08 mg/dL — ABNORMAL HIGH (ref 0.44–1.00)
GFR, Estimated: 58 mL/min — ABNORMAL LOW (ref >60)
Glucose, Bld: 171 mg/dL — ABNORMAL HIGH (ref 70–99)
Potassium: 4.1 mmol/L (ref 3.5–5.1)
Sodium: 138 mmol/L (ref 135–145)

## 2020-06-13 LAB — URINALYSIS, COMPLETE (UACMP) WITH MICROSCOPIC
Bacteria, UA: NONE SEEN
Bilirubin Urine: NEGATIVE
Glucose, UA: >=500 mg/dL — AB
Hgb urine dipstick: NEGATIVE
Ketones, ur: NEGATIVE mg/dL
Leukocytes,Ua: NEGATIVE
Nitrite: NEGATIVE
Protein, ur: NEGATIVE mg/dL
Specific Gravity, Urine: 1.025 (ref 1.005–1.030)
pH: 5 (ref 5.0–8.0)

## 2020-06-13 LAB — CBG MONITORING, ED
Glucose-Capillary: 104 mg/dL — ABNORMAL HIGH (ref 70–99)
Glucose-Capillary: 105 mg/dL — ABNORMAL HIGH (ref 70–99)

## 2020-06-13 LAB — COMPREHENSIVE METABOLIC PANEL
ALT: 24 U/L (ref 0–44)
AST: 24 U/L (ref 15–41)
Albumin: 3.2 g/dL — ABNORMAL LOW (ref 3.5–5.0)
Alkaline Phosphatase: 72 U/L (ref 38–126)
Anion gap: 9 (ref 5–15)
BUN: 38 mg/dL — ABNORMAL HIGH (ref 8–23)
CO2: 22 mmol/L (ref 22–32)
Calcium: 7.7 mg/dL — ABNORMAL LOW (ref 8.9–10.3)
Chloride: 105 mmol/L (ref 98–111)
Creatinine, Ser: 1.2 mg/dL — ABNORMAL HIGH (ref 0.44–1.00)
GFR, Estimated: 51 mL/min — ABNORMAL LOW (ref >60)
Glucose, Bld: 269 mg/dL — ABNORMAL HIGH (ref 70–99)
Potassium: 3.9 mmol/L (ref 3.5–5.1)
Sodium: 136 mmol/L (ref 135–145)
Total Bilirubin: 1 mg/dL (ref 0.3–1.2)
Total Protein: 5.9 g/dL — ABNORMAL LOW (ref 6.5–8.1)

## 2020-06-13 LAB — CBC
HCT: 32.7 % — ABNORMAL LOW (ref 36.0–46.0)
Hemoglobin: 10.3 g/dL — ABNORMAL LOW (ref 12.0–15.0)
MCH: 26.7 pg (ref 26.0–34.0)
MCHC: 31.5 g/dL (ref 30.0–36.0)
MCV: 84.7 fL (ref 80.0–100.0)
Platelets: 194 10*3/uL (ref 150–400)
RBC: 3.86 MIL/uL — ABNORMAL LOW (ref 3.87–5.11)
RDW: 15.1 % (ref 11.5–15.5)
WBC: 7.1 10*3/uL (ref 4.0–10.5)
nRBC: 0 % (ref 0.0–0.2)

## 2020-06-13 LAB — HEMOGLOBIN A1C
Hgb A1c MFr Bld: 7 % — ABNORMAL HIGH (ref 4.8–5.6)
Mean Plasma Glucose: 154.2 mg/dL

## 2020-06-13 LAB — GLUCOSE, CAPILLARY
Glucose-Capillary: 113 mg/dL — ABNORMAL HIGH (ref 70–99)
Glucose-Capillary: 140 mg/dL — ABNORMAL HIGH (ref 70–99)

## 2020-06-13 LAB — RESP PANEL BY RT-PCR (FLU A&B, COVID) ARPGX2
Influenza A by PCR: NEGATIVE
Influenza B by PCR: NEGATIVE
SARS Coronavirus 2 by RT PCR: NEGATIVE

## 2020-06-13 LAB — HIV ANTIBODY (ROUTINE TESTING W REFLEX): HIV Screen 4th Generation wRfx: NONREACTIVE

## 2020-06-13 MED ORDER — ONDANSETRON HCL 4 MG PO TABS: 4.0000 mg | ORAL_TABLET | Freq: Four times a day (QID) | ORAL | Status: DC | PRN

## 2020-06-13 MED ORDER — LACTATED RINGERS IV BOLUS
1000.0000 mL | Freq: Once | INTRAVENOUS | Status: AC
  Administered 2020-06-13: 1000 mL via INTRAVENOUS

## 2020-06-13 MED ORDER — LACTATED RINGERS IV SOLN: INTRAVENOUS | Status: DC

## 2020-06-13 MED ORDER — ONDANSETRON HCL 4 MG/2ML IJ SOLN
4.0000 mg | Freq: Once | INTRAMUSCULAR | Status: AC
  Administered 2020-06-13: 4 mg via INTRAVENOUS
  Filled 2020-06-13: qty 2

## 2020-06-13 MED ORDER — ENOXAPARIN SODIUM 40 MG/0.4ML ~~LOC~~ SOLN: 40.0000 mg | SUBCUTANEOUS | Status: DC

## 2020-06-13 MED ORDER — INSULIN ASPART 100 UNIT/ML ~~LOC~~ SOLN: 0.0000 [IU] | Freq: Every day | SUBCUTANEOUS | Status: DC

## 2020-06-13 MED ORDER — IPRATROPIUM-ALBUTEROL 20-100 MCG/ACT IN AERS
1.0000 | INHALATION_SPRAY | Freq: Four times a day (QID) | RESPIRATORY_TRACT | Status: DC | PRN
  Filled 2020-06-13: qty 4

## 2020-06-13 MED ORDER — ACETAMINOPHEN 325 MG PO TABS: 650.0000 mg | ORAL_TABLET | Freq: Four times a day (QID) | ORAL | Status: DC | PRN

## 2020-06-13 MED ORDER — ONDANSETRON HCL 4 MG/2ML IJ SOLN: 4.0000 mg | Freq: Four times a day (QID) | INTRAMUSCULAR | Status: DC | PRN

## 2020-06-13 MED ORDER — ACETAMINOPHEN 500 MG PO TABS
1000.0000 mg | ORAL_TABLET | Freq: Once | ORAL | Status: AC
  Administered 2020-06-13: 1000 mg via ORAL
  Filled 2020-06-13: qty 2

## 2020-06-13 MED ORDER — ACETAMINOPHEN 650 MG RE SUPP: 650.0000 mg | Freq: Four times a day (QID) | RECTAL | Status: DC | PRN

## 2020-06-13 MED ORDER — INSULIN ASPART 100 UNIT/ML ~~LOC~~ SOLN
0.0000 [IU] | Freq: Three times a day (TID) | SUBCUTANEOUS | Status: DC
  Administered 2020-06-14: 2 [IU] via SUBCUTANEOUS
  Filled 2020-06-13: qty 1

## 2020-06-13 NOTE — H&P (Signed)
History and Physical    SABA GOMM VEL:[id number] DOB: Apr 26, 1955 DOA: 06/13/2020  PCP: Celene Squibb, MD   Patient coming from: Home  I have personally briefly reviewed patient's old medical records in [place]  Chief Complaint: Weakness  HPI: Shelby Butler is a 65 y.o. female with medical history significant for HTN, diabetes, chronic headaches and chronic pain on chronic opioids on multiple sedating meds, COPD and OSA who presents by EMS with a 2-day history of vomiting, diarrhea and progressive weakness.  Patient had several episodes of nonbloody nonbilious vomiting, associated with multiple episodes of watery diarrhea which have started to improve but she continues to have protracted weakness.  She denies cough, fever or chills and denies abdominal pain or chest pain.  She has no dysuria.  No affected contacts and denies offending foods.  Patient continue taking her regular medication during her illness.  On arrival of EMS she was mildly hypoxic with O2 sat at 89 requiring 2 L nasal cannula and she was hypotensive with systolic blood pressure in the 60s. ED course: On arrival BP 63/27 with pulse 75 O2 sat 95% on room air.  Afebrile blood work significant for creatinine of 1.2, up from baseline of 0.74.  Hemoglobin 11.2 slightly below baseline.  COVID and flu negative EKG, reviewed and interpreted myself, sinus bradycardia at 57 with no acute ST-T wave changes Imaging chest x-ray with no evidence of active pulmonary disease  Patient received 3 L of LR in the emergency room with slow improvement in BP to systolic in the 25E.  Given that patient took all of her home medications on the night of arrival including multiple antihypertensives, observation requested.  Review of Systems: As per HPI otherwise all other systems on review of systems negative.    Past Medical History:  Diagnosis Date  . Anxiety   . Chronic pain   . Depression   . Diabetes mellitus without complication  ([place])   . Diverticula, colon   . Hyperlipidemia   . Insomnia   . RLS (restless legs syndrome)   . Scoliosis   . Sleep apnea   . Syncope     Past Surgical History:  Procedure Laterality Date  . ABDOMINAL HYSTERECTOMY    . APPENDECTOMY    . BREAST EXCISIONAL BIOPSY Right 2010?   benign  . KNEE SURGERY Left    x3     reports that she has never smoked. She has never used smokeless tobacco. She reports current alcohol use. She reports that she does not use drugs.  No Known Allergies  Family History  Problem Relation Age of Onset  . Stroke Father   . Diabetes Sister       Prior to Admission medications   Medication Sig Start Date End Date Taking? Authorizing Provider  ADVAIR DISKUS 250-50 MCG/DOSE AEPB Inhale 1 puff into the lungs 2 (two) times daily. 05/15/15   Kathrine Haddock, NP  amLODipine (NORVASC) 5 MG tablet Take 5 mg by mouth at bedtime. 08/27/19   [provider]  Ascorbic Acid (VITAMIN C) 1000 MG tablet Take 1,000 mg by mouth daily.    [provider]  aspirin EC 81 MG tablet Take 81 mg by mouth daily.    [provider]  atorvastatin (LIPITOR) 40 MG tablet Take 1 tablet (40 mg total) by mouth daily. 05/15/15   Kathrine Haddock, NP  Azelastine HCl 0.15 % SOLN Place 2 sprays into the nose in the morning and at  bedtime. 08/10/18   [provider]  baclofen (LIORESAL) 10 MG tablet Take 1 tablet by mouth 2 (two) times daily as needed. Patient not taking: Reported on 10/23/2019 09/24/14   [provider]  blood glucose meter kit and supplies KIT Dispense based on patient and insurance preference. Use up to four times daily as directed. E11.9 05/15/15   Kathrine Haddock, NP  Cholecalciferol (VITAMIN D3) 10 MCG (400 UNIT) CAPS Take 1 capsule by mouth 1 day or 1 dose.    [provider]  diazepam (VALIUM) 5 MG tablet Take 1 tablet by mouth as needed.    [provider]  Dulaglutide (TRULICITY) 1.5 RA/3.0NM SOPN Inject 0.75 mg into  the skin once a week. With needles please 05/15/15   Kathrine Haddock, NP  fluticasone [facility]) 50 MCG/ACT nasal spray Place 2 sprays into both nostrils daily. 09/11/19   [provider]  gabapentin (NEURONTIN) 800 MG tablet Take 1 tablet by mouth at bedtime. 09/18/14   [provider]  glipiZIDE (GLUCOTROL) 10 MG tablet Take 10 mg by mouth 2 (two) times daily. 07/21/19   [provider]  HYDROcodone-acetaminophen (NORCO) 7.5-325 MG tablet Take 1 tablet by mouth every 6 (six) hours as needed. Patient not taking: Reported on 10/23/2019 09/21/19   [provider]  isosorbide mononitrate (IMDUR) 30 MG 24 hr tablet Take 1 tablet (30 mg total) by mouth daily. 05/15/15   Kathrine Haddock, NP  JARDIANCE 25 MG TABS tablet Take 25 mg by mouth daily. 08/30/19   [provider]  lisinopril (ZESTRIL) 40 MG tablet Take 40 mg by mouth daily. 07/21/19   [provider]  melatonin 5 MG TABS Take 5 mg by mouth.    [provider]  meloxicam (MOBIC) 15 MG tablet Take 15 mg by mouth daily.    [provider]  metFORMIN (GLUCOPHAGE) 1000 MG tablet Take 1 tablet (1,000 mg total) by mouth 2 (two) times daily with a meal. 05/15/15   Kathrine Haddock, NP  methocarbamol (ROBAXIN) 500 MG tablet Take 1,000 mg by mouth every 6 (six) hours as needed. 08/31/19   [provider]  metoprolol tartrate (LOPRESSOR) 25 MG tablet Take 0.5 tablets (12.5 mg total) by mouth 2 (two) times daily. 05/15/15   Kathrine Haddock, NP  montelukast (SINGULAIR) 10 MG tablet Take 10 mg by mouth daily. 08/11/19   [provider]  naproxen (NAPROSYN) 500 MG tablet Take 1 tablet (500 mg total) by mouth 2 (two) times daily with a meal. Patient not taking: Reported on 10/23/2019 10/11/14   Lavonia Drafts, MD  pantoprazole (PROTONIX) 40 MG tablet Take 40 mg by mouth daily. 08/01/19   [provider]  pregabalin (LYRICA) 100 MG capsule Take 100 mg by mouth 2 (two) times daily.     [provider]  PROAIR HFA [phone] Base) MCG/ACT inhaler Inhale 2 puffs into the lungs every 4 (four) hours as needed. 05/15/15   Kathrine Haddock, NP  SYMBICORT 160-4.5 MCG/ACT inhaler 1 puff 2 (two) times daily. Patient not taking: Reported on 10/23/2019 08/07/19   [provider]  tiZANidine (ZANAFLEX) 4 MG tablet Take 3 tablets by mouth at bedtime. 08/09/18   [provider]  traMADol (ULTRAM) 50 MG tablet Take 50 mg by mouth every 6 (six) hours as needed.    [provider]  traZODone (DESYREL) 50 MG tablet Take 1 tablet (50 mg total) by mouth at bedtime. 05/15/15   Kathrine Haddock, NP  Physical Exam: Vitals:   06/13/20 0240 06/13/20 0300 06/13/20 0330 06/13/20 0400  BP: (!) 88/44 (!) 86/43 (!) 94/44 91/64  Pulse: 63 61 61 (!) 54  Resp: (!) 25 (!) '22 20 20  ' Temp:      TempSrc:      SpO2: 97% 94% 92% 93%  Weight:      Height:         Vitals:   06/13/20 0240 06/13/20 0300 06/13/20 0330 06/13/20 0400  BP: (!) 88/44 (!) 86/43 (!) 94/44 91/64  Pulse: 63 61 61 (!) 54  Resp: (!) 25 (!) '22 20 20  ' Temp:      TempSrc:      SpO2: 97% 94% 92% 93%  Weight:      Height:          Constitutional:  Drowsy but arousable and oriented x 3 . Not in any apparent distress HEENT:      Head: Normocephalic and atraumatic.         Eyes: PERLA, EOMI, Conjunctivae are normal. Sclera is non-icteric.       Mouth/Throat: Mucous membranes are moist.       Neck: Supple with no signs of meningismus. Cardiovascular: Regular rate and rhythm. No murmurs, gallops, or rubs. 2+ symmetrical distal pulses are present . No JVD. No LE edema Respiratory: Respiratory effort normal .Lungs sounds clear bilaterally. No wheezes, crackles, or rhonchi.  Gastrointestinal: Soft, non tender, and non distended with positive bowel sounds.  Genitourinary: No CVA tenderness. Musculoskeletal: Nontender with normal range of motion in all extremities. No cyanosis, or erythema of  extremities. Neurologic:  Face is symmetric. Moving all extremities. No gross focal neurologic deficits . Skin: Skin is warm, dry.  No rash or ulcers Psychiatric: Mood and affect are normal    Labs on Admission: I have personally reviewed following labs and imaging studies  CBC: Recent Labs  Lab 06/13/20 0137  WBC 6.3  NEUTROABS 3.8  HGB 11.2*  HCT 35.0*  MCV 84.1  PLT 893   Basic Metabolic Panel: Recent Labs  Lab 06/13/20 0137  NA 136  K 3.9  CL 105  CO2 22  GLUCOSE 269*  BUN 38*  CREATININE 1.20*  CALCIUM 7.7*   GFR: Estimated Creatinine Clearance: 50.5 mL/min (A) (by C-G formula based on SCr of 1.2 mg/dL (H)). Liver Function Tests: Recent Labs  Lab 06/13/20 0137  AST 24  ALT 24  ALKPHOS 72  BILITOT 1.0  PROT 5.9*  ALBUMIN 3.2*   No results for input(s): LIPASE, AMYLASE in the last 168 hours. No results for input(s): AMMONIA in the last 168 hours. Coagulation Profile: No results for input(s): INR, PROTIME in the last 168 hours. Cardiac Enzymes: No results for input(s): CKTOTAL, CKMB, CKMBINDEX, TROPONINI in the last 168 hours. BNP (last 3 results) No results for input(s): PROBNP in the last 8760 hours. HbA1C: No results for input(s): HGBA1C in the last 72 hours. CBG: No results for input(s): GLUCAP in the last 168 hours. Lipid Profile: No results for input(s): CHOL, HDL, LDLCALC, TRIG, CHOLHDL, LDLDIRECT in the last 72 hours. Thyroid Function Tests: No results for input(s): TSH, T4TOTAL, FREET4, T3FREE, THYROIDAB in the last 72 hours. Anemia Panel: No results for input(s): VITAMINB12, FOLATE, FERRITIN, TIBC, IRON, RETICCTPCT in the last 72 hours. Urine analysis: No results found for: COLORURINE, APPEARANCEUR, [place], , GLUCOSEU, HGBUR, BILIRUBINUR, KETONESUR, PROTEINUR, UROBILINOGEN, NITRITE, LEUKOCYTESUR  Radiological Exams on Admission: DG Chest Portable 1 View  Result Date: 06/13/2020 CLINICAL DATA:  Nausea, vomiting, and diarrhea for 2  days. EXAM: PORTABLE CHEST 1 VIEW COMPARISON:  12/21/2019 FINDINGS: Shallow inspiration. Heart size and pulmonary vascularity are probably normal for technique. No airspace disease or consolidation is suggested. No pleural effusions. No pneumothorax. Thoracic scoliosis convex towards the right. Postoperative changes in the distal right clavicle. Postoperative changes in the cervical spine. IMPRESSION: Shallow inspiration. No evidence of active pulmonary disease. Electronically Signed   By: Lucienne Capers M.D.   On: 06/13/2020 01:50     Assessment/Plan 65 year old female with history of HTN, diabetes, chronic headaches and chronic pain on chronic opioids on multiple sedating meds, COPD and OSA who presents by EMS with a 2-day history of vomiting, diarrhea and progressive weakness.  BP 63/27 on arrival     Hypotension, secondary to hypovolemic and medication    Polypharmacy  -BP on arrival to the emergency room 63/27, improving to 91/64 by the time of admission after 3 L LR -Continue IV hydration -Related mostly to hypovolemia from acute gastroenteritis in the setting of polypharmacy -Hold home antihypertensives and sedating meds if possible -BP lowering meds on med list include amlodipine, lisinopril, metoprolol, Imdur -Sedating meds include Norco, diazepam, baclofen, pregabalin, tizanidine, tramadol, trazodone  Acute gastroenteritis -2 days of vomiting and diarrhea, already improving -Supportive care -Clear liquid diet -Continue IV hydration, IV antiemetics -Stool culture if persisting diarrhea    AKI (acute kidney injury) ([place]) -Creatinine 1.2 up from baseline of 0.74, likely ATN related to hypovolemia from nausea and vomiting -IV hydration -Monitor renal function and avoid nephrotoxins    Essential (primary) hypertension -Antihypertensives being held -BP lowering meds on med list include amlodipine, lisinopril, metoprolol, Imdur    Diabetes mellitus, type 2 (HCC) -Sliding scale  insulin coverage    Mild persistent asthma?  COPD -Continue Symbicort pending med rec    DVT prophylaxis: Lovenox  Code Status: full code  Family Communication:  none  Disposition Plan: Back to previous home environment Consults called: none  Status: Observation    Athena Masse MD [facility]     06/13/2020, 4:29 AM

## 2020-06-13 NOTE — ED Notes (Signed)
Pt has been given toiletry items

## 2020-06-13 NOTE — ED Notes (Signed)
Pt given diet soda to drink and set up for breakfast, pt able to feed self.

## 2020-06-13 NOTE — ED Notes (Signed)
Pt states she gave her 2 rings to her husband, husband confirms that wife gave him the rings.

## 2020-06-13 NOTE — ED Notes (Signed)
Patient is resting comfortably. Call light in reach. Fall precautions in place.  

## 2020-06-13 NOTE — ED Provider Notes (Signed)
[facility] Emergency Department Provider Note  ____________________________________________  Time seen: Approximately 1:45 AM  I have reviewed the triage vital signs and the nursing notes.   HISTORY  Chief Complaint Nausea, Emesis, and Hypotension ((Per EMS))   HPI Shelby Butler is a 65 y.o. female with history of diabetes, hyperlipidemia, sleep apnea, hypertension who presents for evaluation of generalized weakness.  Patient reports 2 days of several daily episodes of nonbloody nonbilious emesis and watery diarrhea.  She reports no vomiting or diarrhea today but this evening she reports feeling very drowsy and very weak.  The drowsiness started after she took trazodone which she usually takes for sleep.  She denies fever or chills, cough, shortness of breath, chest pain, abdominal pain, melena, hematochezia, hematemesis.  No known sick contacts.  She is vaccinated against flu and Covid.   When EMS arrived at the house patient was somnolent, hypoxic with sats of 89% for which she was on 2 L nasal cannula, she was hypotensive with systolics in the 45G for which she was given 500 cc bolus  Past Medical History:  Diagnosis Date  . Anxiety   . Chronic pain   . Depression   . Diabetes mellitus without complication ([place])   . Diverticula, colon   . Hyperlipidemia   . Insomnia   . RLS (restless legs syndrome)   . Scoliosis   . Sleep apnea   . Syncope     Patient Active Problem List   Diagnosis Date Noted  . Advance directive on file 11/19/2017  . B12 deficiency 07/15/2015  . Medication monitoring encounter 07/15/2015  . Vitamin D deficiency 07/15/2015  . Arachnoid cyst 10/29/2014  . HLD (hyperlipidemia) 10/29/2014  . Biceps tendinitis 10/12/2014  . CFIDS (chronic fatigue and immune dysfunction syndrome) ([place]) 08/07/2014  . Cannot sleep 08/07/2014  . Restless leg 08/07/2014  . Bronchitis, chronic, simple ([place]) 08/07/2014  . Dilatation of esophagus  04/27/2014  . Episode of syncope 04/24/2014  . Benign essential HTN 03/06/2014  . Clinical depression 03/06/2014  . Combined fat and carbohydrate induced hyperlipemia 03/06/2014  . Diabetes mellitus, type 2 ([place]) 03/06/2014  . Chronic pain 01/09/2013  . Gonalgia 01/09/2013  . Diverticulitis 12/20/2012  . Deafness, sensorineural 08/11/2011  . Obstructive apnea 05/12/2011  . Headache disorder 10/18/2010  . Primary localized osteoarthrosis, lower leg 08/22/2010  . Allergic rhinitis 06/17/2009  . Airway hyperreactivity 01/09/2009  . Essential (primary) hypertension 01/09/2009  . Mild persistent asthma 01/09/2009    Past Surgical History:  Procedure Laterality Date  . ABDOMINAL HYSTERECTOMY    . APPENDECTOMY    . BREAST EXCISIONAL BIOPSY Right 2010?   benign  . KNEE SURGERY Left    x3    Prior to Admission medications   Medication Sig Start Date End Date Taking? Authorizing Provider  ADVAIR DISKUS 250-50 MCG/DOSE AEPB Inhale 1 puff into the lungs 2 (two) times daily. 05/15/15   Kathrine Haddock, NP  amLODipine (NORVASC) 5 MG tablet Take 5 mg by mouth at bedtime. 08/27/19   [provider]  Ascorbic Acid (VITAMIN C) 1000 MG tablet Take 1,000 mg by mouth daily.    [provider]  aspirin EC 81 MG tablet Take 81 mg by mouth daily.    [provider]  atorvastatin (LIPITOR) 40 MG tablet Take 1 tablet (40 mg total) by mouth daily. 05/15/15   Kathrine Haddock, NP  Azelastine HCl 0.15 % SOLN Place 2 sprays into the nose in the morning and  at bedtime. 08/10/18   [provider]  baclofen (LIORESAL) 10 MG tablet Take 1 tablet by mouth 2 (two) times daily as needed. Patient not taking: Reported on 10/23/2019 09/24/14   [provider]  blood glucose meter kit and supplies KIT Dispense based on patient and insurance preference. Use up to four times daily as directed. E11.9 05/15/15   Kathrine Haddock, NP  Cholecalciferol (VITAMIN D3) 10 MCG (400 UNIT) CAPS Take 1  capsule by mouth 1 day or 1 dose.    [provider]  diazepam (VALIUM) 5 MG tablet Take 1 tablet by mouth as needed.    [provider]  Dulaglutide (TRULICITY) 1.5 YV/8.5FY SOPN Inject 0.75 mg into the skin once a week. With needles please 05/15/15   Kathrine Haddock, NP  fluticasone [facility]) 50 MCG/ACT nasal spray Place 2 sprays into both nostrils daily. 09/11/19   [provider]  gabapentin (NEURONTIN) 800 MG tablet Take 1 tablet by mouth at bedtime. 09/18/14   [provider]  glipiZIDE (GLUCOTROL) 10 MG tablet Take 10 mg by mouth 2 (two) times daily. 07/21/19   [provider]  HYDROcodone-acetaminophen (NORCO) 7.5-325 MG tablet Take 1 tablet by mouth every 6 (six) hours as needed. Patient not taking: Reported on 10/23/2019 09/21/19   [provider]  isosorbide mononitrate (IMDUR) 30 MG 24 hr tablet Take 1 tablet (30 mg total) by mouth daily. 05/15/15   Kathrine Haddock, NP  JARDIANCE 25 MG TABS tablet Take 25 mg by mouth daily. 08/30/19   [provider]  lisinopril (ZESTRIL) 40 MG tablet Take 40 mg by mouth daily. 07/21/19   [provider]  melatonin 5 MG TABS Take 5 mg by mouth.    [provider]  meloxicam (MOBIC) 15 MG tablet Take 15 mg by mouth daily.    [provider]  metFORMIN (GLUCOPHAGE) 1000 MG tablet Take 1 tablet (1,000 mg total) by mouth 2 (two) times daily with a meal. 05/15/15   Kathrine Haddock, NP  methocarbamol (ROBAXIN) 500 MG tablet Take 1,000 mg by mouth every 6 (six) hours as needed. 08/31/19   [provider]  metoprolol tartrate (LOPRESSOR) 25 MG tablet Take 0.5 tablets (12.5 mg total) by mouth 2 (two) times daily. 05/15/15   Kathrine Haddock, NP  montelukast (SINGULAIR) 10 MG tablet Take 10 mg by mouth daily. 08/11/19   [provider]  naproxen (NAPROSYN) 500 MG tablet Take 1 tablet (500 mg total) by mouth 2 (two) times daily with a meal. Patient not taking: Reported on 10/23/2019  10/11/14   Lavonia Drafts, MD  pantoprazole (PROTONIX) 40 MG tablet Take 40 mg by mouth daily. 08/01/19   [provider]  pregabalin (LYRICA) 100 MG capsule Take 100 mg by mouth 2 (two) times daily.    [provider]  PROAIR HFA 108 [phone] Base) MCG/ACT inhaler Inhale 2 puffs into the lungs every 4 (four) hours as needed. 05/15/15   Kathrine Haddock, NP  SYMBICORT 160-4.5 MCG/ACT inhaler 1 puff 2 (two) times daily. Patient not taking: Reported on 10/23/2019 08/07/19   [provider]  tiZANidine (ZANAFLEX) 4 MG tablet Take 3 tablets by mouth at bedtime. 08/09/18   [provider]  traMADol (ULTRAM) 50 MG tablet Take 50 mg by mouth every 6 (six) hours as needed.    [provider]  traZODone (DESYREL) 50 MG tablet Take 1 tablet (50 mg total) by mouth at bedtime. 05/15/15   Kathrine Haddock, NP  Allergies Patient has no known allergies.  Family History  Problem Relation Age of Onset  . Stroke Father   . Diabetes Sister     Social History Social History   Tobacco Use  . Smoking status: Never Smoker  . Smokeless tobacco: Never Used  Substance Use Topics  . Alcohol use: Yes    Alcohol/week: 0.0 standard drinks    Comment: on occasion  . Drug use: No    Review of Systems  Constitutional: Negative for fever. + Generalized weakness, drowsiness Eyes: Negative for visual changes. ENT: Negative for sore throat. Neck: No neck pain  Cardiovascular: Negative for chest pain. Respiratory: Negative for shortness of breath. Gastrointestinal: Negative for abdominal pain. + vomiting and diarrhea. Genitourinary: Negative for dysuria. Musculoskeletal: Negative for back pain. Skin: Negative for rash. Neurological: Negative for headaches, weakness or numbness. Psych: No SI or HI  ____________________________________________   PHYSICAL EXAM:  VITAL SIGNS: ED Triage Vitals  Enc Vitals Group     BP 06/13/20 0130 (!) 63/27     Pulse Rate 06/13/20 0130 75      Resp 06/13/20 0130 19     Temp 06/13/20 0131 97.9 F (36.6 C)     Temp Source 06/13/20 0131 Oral     SpO2 06/13/20 0130 95 %     Weight 06/13/20 0133 189 lb 9.5 oz (86 kg)     Height 06/13/20 0133 _0  (1.6 m)     Head Circumference --      Peak Flow --      Pain Score 06/13/20 0133 0     Pain Loc --      Pain Edu? --      Excl. in [place]? --     Constitutional: Drowsy but responsible, follows commands and responds to questions appropriately. HEENT:      Head: Normocephalic and atraumatic.         Eyes: Conjunctivae are normal. Sclera is non-icteric.       Mouth/Throat: Mucous membranes are dry.       Neck: Supple with no signs of meningismus. Cardiovascular: Regular rate and rhythm. No murmurs, gallops, or rubs. 2+ symmetrical distal pulses are present in all extremities. No JVD. Respiratory: Normal respiratory effort. Lungs are clear to auscultation bilaterally.  Gastrointestinal: Soft, non tender, and non distended with positive bowel sounds. No rebound or guarding. Musculoskeletal:  No edema, cyanosis, or erythema of extremities. Neurologic: Normal speech and language. Face is symmetric. Moving all extremities. No gross focal neurologic deficits are appreciated. Skin: Skin is warm, dry and intact. No rash noted. Psychiatric: Mood and affect are normal. Speech and behavior are normal.  ____________________________________________   LABS (all labs ordered are listed, but only abnormal results are displayed)  Labs Reviewed  CBC WITH DIFFERENTIAL/PLATELET - Abnormal; Notable for the following components:      Result Value   Hemoglobin 11.2 (*)    HCT 35.0 (*)    All other components within normal limits  COMPREHENSIVE METABOLIC PANEL - Abnormal; Notable for the following components:   Glucose, Bld 269 (*)    BUN 38 (*)    Creatinine, Ser 1.20 (*)    Calcium 7.7 (*)    Total Protein 5.9 (*)    Albumin 3.2 (*)    GFR, Estimated 51 (*)    All other components within normal  limits  RESP PANEL BY RT-PCR (FLU A&B, COVID) ARPGX2  URINALYSIS, COMPLETE (UACMP) WITH MICROSCOPIC   ____________________________________________  EKG  ED ECG  REPORT I, Rudene Re, the attending physician, personally viewed and interpreted this ECG.  Sinus bradycardia with a rate of 57, normal intervals, right axis deviation, no ST elevations or depressions. ____________________________________________  RADIOLOGY  I have personally reviewed the images performed during this visit and I agree with the Radiologist's read.   Interpretation by Radiologist:  DG Chest Portable 1 View  Result Date: 06/13/2020 CLINICAL DATA:  Nausea, vomiting, and diarrhea for 2 days. EXAM: PORTABLE CHEST 1 VIEW COMPARISON:  12/21/2019 FINDINGS: Shallow inspiration. Heart size and pulmonary vascularity are probably normal for technique. No airspace disease or consolidation is suggested. No pleural effusions. No pneumothorax. Thoracic scoliosis convex towards the right. Postoperative changes in the distal right clavicle. Postoperative changes in the cervical spine. IMPRESSION: Shallow inspiration. No evidence of active pulmonary disease. Electronically Signed   By: Lucienne Capers M.D.   On: 06/13/2020 01:50      ____________________________________________   PROCEDURES  Procedure(s) performed:yes .1-3 Lead EKG Interpretation Performed by: Rudene Re, MD Authorized by: Rudene Re, MD     Interpretation: non-specific     ECG rate assessment: normal     Rhythm: sinus rhythm     Ectopy: none     Conduction: normal     Critical Care performed:  None ____________________________________________   INITIAL IMPRESSION / ASSESSMENT AND PLAN / ED COURSE   65 y.o. female with history of diabetes, hyperlipidemia, sleep apnea, hypertension who presents for evaluation of generalized weakness and drowsiness in the setting of 2 days of several daily episodes of vomiting and diarrhea.   Patient is drowsy but easily arousable and responds to questions appropriately.  According to her she took to trazodone just prior to arrival which could be contributing to her sleepiness.  She is complained of no pain at this time.  She is slightly hypoxic with sats of 88%, lungs are clear to auscultation.  She was placed on 2 L nasal cannula, she looks pale and dry, abdomen is soft and nontender, no jaundice or scleral icterus.  EKG nonischemic.  Differential diagnoses including gastroenteritis/ viral illness/ colitis/ GIB/ hepatitis causing dehydration versus AKI versus electrolyte derangements versus anemia versus encephalopathy. Possibly aspiration to justify hypoxia but it could be also oversedation from trazodone in a patient with obstructive sleep apnea.  She is afebrile with a BP of 65/26 and a pulse of 54.  She was started on 2 L nasal cannula.  Blood work and chest x-ray pending.  Patient placed on telemetry for close monitoring of cardiorespiratory status.  Old medical records reviewed.  _________________________ 4:09 AM on 06/13/2020 -----------------------------------------  Labs showing glucose of 269 with a creatinine of 1.2 patient's baseline is 0.74, normal LFTs and lipase, normal white count, no significant anemia.  Covid and flu negative.  Chest x-ray visualized by me with no signs of pneumonia or aspiration, confirmed by radiology.  Patient received 3 L of normal saline and is still hypotensive with BP of 94/44.  This is most likely due to AKI, dehydration, and due to the fact the patient is on several antihypertensive medications which she took all at bedtime.  Therefore will discuss with the hospitalist for admission for hydration and blood pressure monitoring.     _____________________________________________ Please note:  Patient was evaluated in Emergency Department today for the symptoms described in the history of present illness. Patient was evaluated in the context of the  global COVID-19 pandemic, which necessitated consideration that the patient might be at risk for infection with the  SARS-CoV-2 virus that causes COVID-19. Institutional protocols and algorithms that pertain to the evaluation of patients at risk for COVID-19 are in a state of rapid change based on information released by regulatory bodies including the CDC and federal and state organizations. These policies and algorithms were followed during the patient's care in the ED.  Some ED evaluations and interventions may be delayed as a result of limited staffing during the pandemic.    Controlled Substance Database was reviewed by me. ____________________________________________   FINAL CLINICAL IMPRESSION(S) / ED DIAGNOSES   Final diagnoses:  Nausea vomiting and diarrhea  Gastroenteritis  Dehydration      NEW MEDICATIONS STARTED DURING THIS VISIT:  ED Discharge Orders    None       Note:  This document was prepared using Dragon voice recognition software and may include unintentional dictation errors.    Alfred Levins, [place], MD 06/13/20 [phone]

## 2020-06-13 NOTE — ED Notes (Signed)
Pt given incentive spirometer, pt taught how to use it and demonstrated proper use.

## 2020-06-13 NOTE — ED Notes (Signed)
Continued hypotension discussed with Dr. Don Perking, 2nd liter of LR started on a pressure bag at this time.

## 2020-06-13 NOTE — ED Notes (Signed)
Pt tolerating clear liquid diet well, no c/o N/V

## 2020-06-13 NOTE — ED Triage Notes (Signed)
Patient arrives via EMS with complaints of nausea, vomiting, diarrhea x 2 days. Patient reports weakness as well. Patient reports numbness x several years to LLE. Patient reports no change to LLE today. Patient reports drowsiness this evening, but reports taking Trazodone PTA.

## 2020-06-13 NOTE — ED Notes (Signed)
Pt states she did not like purewick, purewick removed, pt placed on bed pan. Pt tried on room air, sats 100% on 2 L [place], pt decreased sats to 90-92% on room air, O2 2 L [place] restarted, 97% on 2 Lnc

## 2020-06-13 NOTE — Progress Notes (Signed)
PROGRESS NOTE   HPI was taken from Dr. Para March: Shelby Butler is a 65 y.o. female with medical history significant for HTN, diabetes, chronic headaches and chronic pain on chronic opioids on multiple sedating meds, COPD and OSA who presents by EMS with a 2-day history of vomiting, diarrhea and progressive weakness.  Patient had several episodes of nonbloody nonbilious vomiting, associated with multiple episodes of watery diarrhea which have started to improve but she continues to have protracted weakness.  She denies cough, fever or chills and denies abdominal pain or chest pain.  She has no dysuria.  No affected contacts and denies offending foods.  Patient continue taking her regular medication during her illness.  On arrival of EMS she was mildly hypoxic with O2 sat at 89 requiring 2 L nasal cannula and she was hypotensive with systolic blood pressure in the 60s. ED course: On arrival BP 63/27 with pulse 75 O2 sat 95% on room air.  Afebrile blood work significant for creatinine of 1.2, up from baseline of 0.74.  Hemoglobin 11.2 slightly below baseline.  COVID and flu negative EKG, reviewed and interpreted myself, sinus bradycardia at 57 with no acute ST-T wave changes Imaging chest x-ray with no evidence of active pulmonary disease  Patient received 3 L of LR in the emergency room with slow improvement in BP to systolic in the 90s.  Given that patient took all of her home medications on the night of arrival including multiple antihypertensives, observation requested.    Shelby Butler  [id number] DOB: 1955/06/16 DOA: 06/13/2020 PCP: Benita Stabile, MD   Assessment & Plan:   Principal Problem:   Hypotension Active Problems:   Chronic pain   Essential (primary) hypertension   Diabetes mellitus, type 2 (HCC)   Mild persistent asthma   Acute gastroenteritis   AKI (acute kidney injury) (HCC)   Polypharmacy   Hypotension: likely secondary to hypovolemic & polypharmacy. Improving.  Continue on IVFs. Continue to hold all BP meds, sedating meds & restart some of them back as BP allows.  Keep MAP >65.  Acute gastroenteritis: etiology unclear. Continue on IVFs. Zofran prn. Continue w/ supportive care.   AKI: baseline Cr 0.74. Possible ATN. Cr is trending down from day prior. Avoid nephrotoxic meds   HTN: continue to hold all BP meds secondary to hypotension   DM2: likely poorly controlled. Continue on SSI w/ accuchecks   Possible mild persistent asthma: w/o exacerbation. Continue on home bronchodilators and encourage incentive spirometry    Obesity: BMI 33.5. Complicates overall care and prognosis    DVT prophylaxis: Lovenox  Code Status: full  Family Communication: Disposition Plan: likely d/c back home  Level of care: Progressive Cardiac   Status is: Observation  The patient remains OBS appropriate and will d/c before 2 midnights.  Dispo: The patient is from: Home              Anticipated d/c is to: Home              Patient currently is not medically stable to d/c.   Difficult to place patient Yes        Consultants:      Procedures:    Antimicrobials:    Subjective: Pt c/o weakness  Objective: Vitals:   06/13/20 0436 06/13/20 0725 06/13/20 0730 06/13/20 0838  BP: (!) 91/51 (!) 92/44 (!) 96/46 (!) 96/46  Pulse: 60 62 60 66  Resp: 20 20 (!) 21 (!) 21  Temp:  TempSrc:      SpO2: 97% 96% 96% 98%  Weight:      Height:       No intake or output data in the 24 hours ending 06/13/20 0839 Filed Weights   06/13/20 0133  Weight: 86 kg    Examination:  General exam: Appears calm but uncomfortable. Obese Respiratory system:  Decreased breath sounds b/l. Cardiovascular system: S1 & S2 +. No rubs, gallops or clicks. Gastrointestinal system: Abdomen is nondistended, soft and nontender.  Normal bowel sounds heard. Central nervous system: Alert and oriented. Moves all extremities  Psychiatry: Judgement and insight appear normal.  Mood & affect appropriate.     Data Reviewed: I have personally reviewed following labs and imaging studies  CBC: Recent Labs  Lab 06/13/20 0137 06/13/20 0502  WBC 6.3 7.1  NEUTROABS 3.8  --   HGB 11.2* 10.3*  HCT 35.0* 32.7*  MCV 84.1 84.7  PLT 214 194   Basic Metabolic Panel: Recent Labs  Lab 06/13/20 0137 06/13/20 0502  NA 136 138  K 3.9 4.1  CL 105 107  CO2 22 23  GLUCOSE 269* 171*  BUN 38* 38*  CREATININE 1.20* 1.08*  CALCIUM 7.7* 7.8*   GFR: Estimated Creatinine Clearance: 56.1 mL/min (A) (by C-G formula based on SCr of 1.08 mg/dL (H)). Liver Function Tests: Recent Labs  Lab 06/13/20 0137  AST 24  ALT 24  ALKPHOS 72  BILITOT 1.0  PROT 5.9*  ALBUMIN 3.2*   No results for input(s): LIPASE, AMYLASE in the last 168 hours. No results for input(s): AMMONIA in the last 168 hours. Coagulation Profile: No results for input(s): INR, PROTIME in the last 168 hours. Cardiac Enzymes: No results for input(s): CKTOTAL, CKMB, CKMBINDEX, TROPONINI in the last 168 hours. BNP (last 3 results) No results for input(s): PROBNP in the last 8760 hours. HbA1C: No results for input(s): HGBA1C in the last 72 hours. CBG: Recent Labs  Lab 06/13/20 0723  GLUCAP 104*   Lipid Profile: No results for input(s): CHOL, HDL, LDLCALC, TRIG, CHOLHDL, LDLDIRECT in the last 72 hours. Thyroid Function Tests: No results for input(s): TSH, T4TOTAL, FREET4, T3FREE, THYROIDAB in the last 72 hours. Anemia Panel: No results for input(s): VITAMINB12, FOLATE, FERRITIN, TIBC, IRON, RETICCTPCT in the last 72 hours. Sepsis Labs: No results for input(s): PROCALCITON, LATICACIDVEN in the last 168 hours.  Recent Results (from the past 240 hour(s))  Resp Panel by RT-PCR (Flu A&B, Covid) Nasopharyngeal Swab     Status: None   Collection Time: 06/13/20  1:37 AM   Specimen: Nasopharyngeal Swab; Nasopharyngeal(NP) swabs in vial transport medium  Result Value Ref Range Status   SARS Coronavirus 2  by RT PCR NEGATIVE NEGATIVE Final    Comment: (NOTE) SARS-CoV-2 target nucleic acids are NOT DETECTED.  The SARS-CoV-2 RNA is generally detectable in upper respiratory specimens during the acute phase of infection. The lowest concentration of SARS-CoV-2 viral copies this assay can detect is 138 copies/mL. A negative result does not preclude SARS-Cov-2 infection and should not be used as the sole basis for treatment or other patient management decisions. A negative result may occur with  improper specimen collection/handling, submission of specimen other than nasopharyngeal swab, presence of viral mutation(s) within the areas targeted by this assay, and inadequate number of viral copies(<138 copies/mL). A negative result must be combined with clinical observations, patient history, and epidemiological information. The expected result is Negative.  Fact Sheet for Patients:  BloggerCourse.com  Fact Sheet for Healthcare Providers:  SeriousBroker.it  This test is no t yet approved or cleared by the [place] and  has been authorized for detection and/or diagnosis of SARS-CoV-2 by FDA under an Emergency Use Authorization (EUA). This EUA will remain  in effect (meaning this test can be used) for the duration of the COVID-19 declaration under Section 564(b)(1) of the Act, 21 U.S.C.section 360bbb-3(b)(1), unless the authorization is terminated  or revoked sooner.       Influenza A by PCR NEGATIVE NEGATIVE Final   Influenza B by PCR NEGATIVE NEGATIVE Final    Comment: (NOTE) The Xpert Xpress SARS-CoV-2/FLU/RSV plus assay is intended as an aid in the diagnosis of influenza from Nasopharyngeal swab specimens and should not be used as a sole basis for treatment. Nasal washings and aspirates are unacceptable for Xpert Xpress SARS-CoV-2/FLU/RSV testing.  Fact Sheet for Patients: BloggerCourse.com  Fact  Sheet for Healthcare Providers: SeriousBroker.it  This test is not yet approved or cleared by the [place] FDA and has been authorized for detection and/or diagnosis of SARS-CoV-2 by FDA under an Emergency Use Authorization (EUA). This EUA will remain in effect (meaning this test can be used) for the duration of the COVID-19 declaration under Section 564(b)(1) of the Act, 21 U.S.C. section 360bbb-3(b)(1), unless the authorization is terminated or revoked.  Performed at [facility], [address] [place], [place] [address]          Radiology Studies: DG Chest Portable 1 View  Result Date: 06/13/2020 CLINICAL DATA:  Nausea, vomiting, and diarrhea for 2 days. EXAM: PORTABLE CHEST 1 VIEW COMPARISON:  12/21/2019 FINDINGS: Shallow inspiration. Heart size and pulmonary vascularity are probably normal for technique. No airspace disease or consolidation is suggested. No pleural effusions. No pneumothorax. Thoracic scoliosis convex towards the right. Postoperative changes in the distal right clavicle. Postoperative changes in the cervical spine. IMPRESSION: Shallow inspiration. No evidence of active pulmonary disease. Electronically Signed   By: Burman Nieves M.D.   On: 06/13/2020 01:50        Scheduled Meds: . enoxaparin (LOVENOX) injection  40 mg Subcutaneous Q24H  . insulin aspart  0-15 Units Subcutaneous TID WC  . insulin aspart  0-5 Units Subcutaneous QHS   Continuous Infusions: . lactated ringers 125 mL/hr at 06/13/20 0502     LOS: 0 days    Time spent: 32 mins    Charise Killian, MD [facility] Pager [phone]  If 7PM-7AM, please contact night-coverage 06/13/2020, 8:39 AM

## 2020-06-13 NOTE — ED Notes (Signed)
Family in room with pt

## 2020-06-14 DIAGNOSIS — K529 Noninfective gastroenteritis and colitis, unspecified: Secondary | ICD-10-CM

## 2020-06-14 DIAGNOSIS — N179 Acute kidney failure, unspecified: Secondary | ICD-10-CM

## 2020-06-14 DIAGNOSIS — I10 Essential (primary) hypertension: Secondary | ICD-10-CM

## 2020-06-14 DIAGNOSIS — E861 Hypovolemia: Secondary | ICD-10-CM | POA: Diagnosis not present

## 2020-06-14 DIAGNOSIS — I9589 Other hypotension: Secondary | ICD-10-CM | POA: Diagnosis not present

## 2020-06-14 LAB — CBC
HCT: 37.8 % (ref 36.0–46.0)
Hemoglobin: 12.4 g/dL (ref 12.0–15.0)
MCH: 27 pg (ref 26.0–34.0)
MCHC: 32.8 g/dL (ref 30.0–36.0)
MCV: 82.2 fL (ref 80.0–100.0)
Platelets: 188 10*3/uL (ref 150–400)
RBC: 4.6 MIL/uL (ref 3.87–5.11)
RDW: 14.3 % (ref 11.5–15.5)
WBC: 5.1 10*3/uL (ref 4.0–10.5)
nRBC: 0 % (ref 0.0–0.2)

## 2020-06-14 LAB — BASIC METABOLIC PANEL
Anion gap: 9 (ref 5–15)
BUN: 12 mg/dL (ref 8–23)
CO2: 31 mmol/L (ref 22–32)
Calcium: 9.1 mg/dL (ref 8.9–10.3)
Chloride: 99 mmol/L (ref 98–111)
Creatinine, Ser: 0.51 mg/dL (ref 0.44–1.00)
GFR, Estimated: >60 mL/min (ref >60)
Glucose, Bld: 140 mg/dL — ABNORMAL HIGH (ref 70–99)
Potassium: 3.9 mmol/L (ref 3.5–5.1)
Sodium: 139 mmol/L (ref 135–145)

## 2020-06-14 LAB — GLUCOSE, CAPILLARY: Glucose-Capillary: 144 mg/dL — ABNORMAL HIGH (ref 70–99)

## 2020-06-14 NOTE — Plan of Care (Signed)
Discharge teaching completed with patient who is in stable condition. 

## 2020-06-14 NOTE — Discharge Summary (Signed)
Shelby Butler FSE:[id number] DOB: September 11, 1955 DOA: 06/13/2020  PCP: Celene Squibb, MD  Admit date: 06/13/2020 Discharge date: 06/14/2020  Admitted From: Home Disposition: Home  Recommendations for Outpatient Follow-up:  1. Follow up with PCP in 1 week 2. Please obtain BMP/CBC in one week      Discharge Condition:Stable CODE STATUS: Full Diet recommendation: Carb modified    Brief/Interim Summary: Per [facility]:WYSHUO L Shelby Butler is a 65 y.o. female with medical history significant for HTN, diabetes, chronic headaches and chronic pain on chronic opioids on multiple sedating meds, COPD and OSA who presents by EMS with a 2-day history of vomiting, diarrhea and progressive weakness.  Patient had several episodes of nonbloody nonbilious vomiting, associated with multiple episodes of watery diarrhea which had started to improve but she continued to have protracted weakness.  Her creatinine on admission was 1.2 elevated.  Covid and flu was negative.  She was admitted to the hospital.  Started on IV fluids as her systolic blood pressure was in the 80s.  She was diagnosed with acute gastroenteritis.  Her symptoms have resolved.  She has tolerated p.o. intake.  No further diarrhea was reported this a.m.  Her blood pressure is stable now.  Hypotension: likely secondary tohypovolemic & polypharmacy. Improved with IV fluids. Can resume her amlodipine but will hold off on her other outpatient blood pressure medications until she follows up with her primary care.  She was instructed to check her blood pressure prior to resuming any meds.   Acute gastroenteritis: Likely viral  Received IV fluids and supportive care Symptoms resolved    AKI: Likely prerenal from her acute gastroenteritis and hypotension.  It resolved with IV fluids.  Creatinine today 0.51.     HTN: Can resume her amlodipine as outpatient.  Will hold off on her other blood pressure medications until she follows up with her primary care  DM2:  Hemoglobin A1c 7.0.  Continue her outpatient medications Further management per her primary care    Possible mild persistent asthma: w/o exacerbation.  Continue home medications   Obesity: BMI 33.5. Complicates overall care and prognosis    Discharge Diagnoses:  Principal Problem:   Hypotension Active Problems:   Chronic pain   Essential (primary) hypertension   Diabetes mellitus, type 2 (HCC)   Mild persistent asthma   Acute gastroenteritis   AKI (acute kidney injury) ([place])   Polypharmacy    Discharge Instructions  Discharge Instructions    Diet - low sodium heart healthy   Complete by: As directed    Diet Carb Modified   Complete by: As directed    Discharge instructions   Complete by: As directed    Follow up with pcp in one week to discuss medication management.  For blood pressure can resume amlodipine, but after check blood pressure to see if still elevated before resuming other bp meds.   Increase activity slowly   Complete by: As directed      Allergies as of 06/14/2020   No Known Allergies     Medication List    STOP taking these medications   isosorbide mononitrate 30 MG 24 hr tablet Commonly known as: IMDUR   meloxicam 15 MG tablet Commonly known as: MOBIC   methocarbamol 500 MG tablet Commonly known as: ROBAXIN   metoprolol tartrate 25 MG tablet Commonly known as: LOPRESSOR   naproxen 500 MG tablet Commonly known as: Naprosyn   pregabalin 100 MG capsule Commonly known as: LYRICA   propranolol 60 MG tablet  Commonly known as: INDERAL   traMADol 50 MG tablet Commonly known as: ULTRAM   traZODone 50 MG tablet Commonly known as: DESYREL     TAKE these medications   Advair Diskus 250-50 MCG/DOSE Aepb Generic drug: Fluticasone-Salmeterol Inhale 1 puff into the lungs 2 (two) times daily.   amLODipine 5 MG tablet Commonly known as: NORVASC Take 5 mg by mouth at bedtime.   aspirin EC 81 MG tablet Take 81 mg by mouth daily.    atorvastatin 40 MG tablet Commonly known as: LIPITOR Take 1 tablet (40 mg total) by mouth daily.   Azelastine HCl 0.15 % Soln Place 2 sprays into the nose in the morning and at bedtime.   blood glucose meter kit and supplies Kit Dispense based on patient and insurance preference. Use up to four times daily as directed. E11.9   Dulaglutide 1.5 MG/0.5ML Sopn Commonly known as: Trulicity Inject 1.82 mg into the skin once a week. With needles please   gabapentin 800 MG tablet Commonly known as: NEURONTIN Take 1 tablet by mouth at bedtime.   glipiZIDE 10 MG tablet Commonly known as: GLUCOTROL Take 10 mg by mouth 2 (two) times daily.   HYDROcodone-acetaminophen 7.5-325 MG tablet Commonly known as: NORCO Take 1 tablet by mouth every 6 (six) hours as needed.   Jardiance 25 MG Tabs tablet Generic drug: empagliflozin Take 25 mg by mouth daily.   melatonin 5 MG Tabs Take 5 mg by mouth.   metFORMIN 1000 MG tablet Commonly known as: GLUCOPHAGE Take 1 tablet (1,000 mg total) by mouth 2 (two) times daily with a meal.   montelukast 10 MG tablet Commonly known as: SINGULAIR Take 10 mg by mouth daily.   pantoprazole 40 MG tablet Commonly known as: PROTONIX Take 40 mg by mouth daily.   ProAir HFA 108 (90 Base) MCG/ACT inhaler Generic drug: albuterol Inhale 2 puffs into the lungs every 4 (four) hours as needed.   Symbicort 160-4.5 MCG/ACT inhaler Generic drug: budesonide-formoterol 1 puff 2 (two) times daily.   tiZANidine 4 MG tablet Commonly known as: ZANAFLEX Take 3 tablets by mouth at bedtime.   vitamin C 1000 MG tablet Take 1,000 mg by mouth daily.   Vitamin D3 10 MCG (400 UNIT) Caps Take 1 capsule by mouth 1 day or 1 dose.       Follow-up Information    Celene Squibb, MD Follow up in 1 week(s).   Specialty: Internal Medicine Contact information: [place] [address] [phone]              No Known  Allergies  Consultations:     Procedures/Studies: DG Chest Portable 1 View  Result Date: 06/13/2020 CLINICAL DATA:  Nausea, vomiting, and diarrhea for 2 days. EXAM: PORTABLE CHEST 1 VIEW COMPARISON:  12/21/2019 FINDINGS: Shallow inspiration. Heart size and pulmonary vascularity are probably normal for technique. No airspace disease or consolidation is suggested. No pleural effusions. No pneumothorax. Thoracic scoliosis convex towards the right. Postoperative changes in the distal right clavicle. Postoperative changes in the cervical spine. IMPRESSION: Shallow inspiration. No evidence of active pulmonary disease. Electronically Signed   By: Lucienne Capers M.D.   On: 06/13/2020 01:50       Subjective: Feels better.  No diarrhea, nausea, vomiting.  No other symptoms reported.  Tolerated p.o. intake.   Discharge Exam: Vitals:   06/14/20 0428 06/14/20 0739  BP: (!) 155/83 (!) 142/73  Pulse: 81 81  Resp: 18 18  Temp:  98.1 F (36.7 C) 98.2 F (36.8 C)  SpO2: 93% 95%   Vitals:   06/13/20 1703 06/13/20 1945 06/14/20 0428 06/14/20 0739  BP: (!) 158/56 (!) 159/71 (!) 155/83 (!) 142/73  Pulse: 83 90 81 81  Resp: '18 16 18 18  ' Temp: 98.4 F (36.9 C) 98 F (36.7 C) 98.1 F (36.7 C) 98.2 F (36.8 C)  TempSrc: Oral Oral Oral Oral  SpO2: 100% 96% 93% 95%  Weight:      Height:        General: Pt is alert, awake, not in acute distress Cardiovascular: RRR, S1/S2 +, no rubs, no gallops Respiratory: CTA bilaterally, no wheezing, no rhonchi Abdominal: Soft, NT, ND, bowel sounds + Extremities: no edema, no cyanosis    The results of significant diagnostics from this hospitalization (including imaging, microbiology, ancillary and laboratory) are listed below for reference.     Microbiology: Recent Results (from the past 240 hour(s))  Resp Panel by RT-PCR (Flu A&B, Covid) Nasopharyngeal Swab     Status: None   Collection Time: 06/13/20  1:37 AM   Specimen: Nasopharyngeal Swab;  Nasopharyngeal(NP) swabs in vial transport medium  Result Value Ref Range Status   SARS Coronavirus 2 by RT PCR NEGATIVE NEGATIVE Final    Comment: (NOTE) SARS-CoV-2 target nucleic acids are NOT DETECTED.  The SARS-CoV-2 RNA is generally detectable in upper respiratory specimens during the acute phase of infection. The lowest concentration of SARS-CoV-2 viral copies this assay can detect is 138 copies/mL. A negative result does not preclude SARS-Cov-2 infection and should not be used as the sole basis for treatment or other patient management decisions. A negative result may occur with  improper specimen collection/handling, submission of specimen other than nasopharyngeal swab, presence of viral mutation(s) within the areas targeted by this assay, and inadequate number of viral copies(<138 copies/mL). A negative result must be combined with clinical observations, patient history, and epidemiological information. The expected result is Negative.  Fact Sheet for Patients:  EntrepreneurPulse.com.au  Fact Sheet for Healthcare Providers:  IncredibleEmployment.be  This test is no t yet approved or cleared by the [place] FDA and  has been authorized for detection and/or diagnosis of SARS-CoV-2 by FDA under an Emergency Use Authorization (EUA). This EUA will remain  in effect (meaning this test can be used) for the duration of the COVID-19 declaration under Section 564(b)(1) of the Act, 21 U.S.C.section 360bbb-3(b)(1), unless the authorization is terminated  or revoked sooner.       Influenza A by PCR NEGATIVE NEGATIVE Final   Influenza B by PCR NEGATIVE NEGATIVE Final    Comment: (NOTE) The Xpert Xpress SARS-CoV-2/FLU/RSV plus assay is intended as an aid in the diagnosis of influenza from Nasopharyngeal swab specimens and should not be used as a sole basis for treatment. Nasal washings and aspirates are unacceptable for Xpert Xpress  SARS-CoV-2/FLU/RSV testing.  Fact Sheet for Patients: EntrepreneurPulse.com.au  Fact Sheet for Healthcare Providers: IncredibleEmployment.be  This test is not yet approved or cleared by the [place] FDA and has been authorized for detection and/or diagnosis of SARS-CoV-2 by FDA under an Emergency Use Authorization (EUA). This EUA will remain in effect (meaning this test can be used) for the duration of the COVID-19 declaration under Section 564(b)(1) of the Act, 21 U.S.C. section 360bbb-3(b)(1), unless the authorization is terminated or revoked.  Performed at [facility], [address] [place], [place] [address]      Labs: BNP (last 3 results) No results for  input(s): BNP in the last 8760 hours. Basic Metabolic Panel: Recent Labs  Lab 06/13/20 0137 06/13/20 0502 06/14/20 0522  NA 136 138 139  K 3.9 4.1 3.9  CL 105 107 99  CO2 '22 23 31  ' GLUCOSE 269* 171* 140*  BUN 38* 38* 12  CREATININE 1.20* 1.08* 0.51  CALCIUM 7.7* 7.8* 9.1   Liver Function Tests: Recent Labs  Lab 06/13/20 0137  AST 24  ALT 24  ALKPHOS 72  BILITOT 1.0  PROT 5.9*  ALBUMIN 3.2*   No results for input(s): LIPASE, AMYLASE in the last 168 hours. No results for input(s): AMMONIA in the last 168 hours. CBC: Recent Labs  Lab 06/13/20 0137 06/13/20 0502 06/14/20 0522  WBC 6.3 7.1 5.1  NEUTROABS 3.8  --   --   HGB 11.2* 10.3* 12.4  HCT 35.0* 32.7* 37.8  MCV 84.1 84.7 82.2  PLT 214 194 188   Cardiac Enzymes: No results for input(s): CKTOTAL, CKMB, CKMBINDEX, TROPONINI in the last 168 hours. BNP: Invalid input(s): POCBNP CBG: Recent Labs  Lab 06/13/20 0723 06/13/20 1158 06/13/20 1726 06/13/20 2117 06/14/20 0732  GLUCAP 104* 105* 113* 140* 144*   D-Dimer No results for input(s): DDIMER in the last 72 hours. Hgb A1c Recent Labs    06/13/20 0502  HGBA1C 7.0*   Lipid Profile No results for input(s): CHOL, HDL, LDLCALC, TRIG,  CHOLHDL, LDLDIRECT in the last 72 hours. Thyroid function studies No results for input(s): TSH, T4TOTAL, T3FREE, THYROIDAB in the last 72 hours.  Invalid input(s): FREET3 Anemia work up No results for input(s): VITAMINB12, FOLATE, FERRITIN, TIBC, IRON, RETICCTPCT in the last 72 hours. Urinalysis    Component Value Date/Time   COLORURINE YELLOW (A) 06/13/2020 0838   APPEARANCEUR HAZY (A) 06/13/2020 0838   LABSPEC 1.025 06/13/2020 0838   PHURINE 5.0 06/13/2020 0838   GLUCOSEU >=500 (A) 06/13/2020 0838   HGBUR NEGATIVE 06/13/2020 0838   BILIRUBINUR NEGATIVE 06/13/2020 0838   KETONESUR NEGATIVE 06/13/2020 0838   PROTEINUR NEGATIVE 06/13/2020 0838   NITRITE NEGATIVE 06/13/2020 0838   LEUKOCYTESUR NEGATIVE 06/13/2020 0838   Sepsis Labs Invalid input(s): PROCALCITONIN,  WBC,  LACTICIDVEN Microbiology Recent Results (from the past 240 hour(s))  Resp Panel by RT-PCR (Flu A&B, Covid) Nasopharyngeal Swab     Status: None   Collection Time: 06/13/20  1:37 AM   Specimen: Nasopharyngeal Swab; Nasopharyngeal(NP) swabs in vial transport medium  Result Value Ref Range Status   SARS Coronavirus 2 by RT PCR NEGATIVE NEGATIVE Final    Comment: (NOTE) SARS-CoV-2 target nucleic acids are NOT DETECTED.  The SARS-CoV-2 RNA is generally detectable in upper respiratory specimens during the acute phase of infection. The lowest concentration of SARS-CoV-2 viral copies this assay can detect is 138 copies/mL. A negative result does not preclude SARS-Cov-2 infection and should not be used as the sole basis for treatment or other patient management decisions. A negative result may occur with  improper specimen collection/handling, submission of specimen other than nasopharyngeal swab, presence of viral mutation(s) within the areas targeted by this assay, and inadequate number of viral copies(<138 copies/mL). A negative result must be combined with clinical observations, patient history, and  epidemiological information. The expected result is Negative.  Fact Sheet for Patients:  EntrepreneurPulse.com.au  Fact Sheet for Healthcare Providers:  IncredibleEmployment.be  This test is no t yet approved or cleared by the [place] FDA and  has been authorized for detection and/or diagnosis of SARS-CoV-2 by FDA under an Emergency Use Authorization (  EUA). This EUA will remain  in effect (meaning this test can be used) for the duration of the COVID-19 declaration under Section 564(b)(1) of the Act, 21 U.S.C.section 360bbb-3(b)(1), unless the authorization is terminated  or revoked sooner.       Influenza A by PCR NEGATIVE NEGATIVE Final   Influenza B by PCR NEGATIVE NEGATIVE Final    Comment: (NOTE) The Xpert Xpress SARS-CoV-2/FLU/RSV plus assay is intended as an aid in the diagnosis of influenza from Nasopharyngeal swab specimens and should not be used as a sole basis for treatment. Nasal washings and aspirates are unacceptable for Xpert Xpress SARS-CoV-2/FLU/RSV testing.  Fact Sheet for Patients: EntrepreneurPulse.com.au  Fact Sheet for Healthcare Providers: IncredibleEmployment.be  This test is not yet approved or cleared by the [place] FDA and has been authorized for detection and/or diagnosis of SARS-CoV-2 by FDA under an Emergency Use Authorization (EUA). This EUA will remain in effect (meaning this test can be used) for the duration of the COVID-19 declaration under Section 564(b)(1) of the Act, 21 U.S.C. section 360bbb-3(b)(1), unless the authorization is terminated or revoked.  Performed at [facility], [address], [place], [place] [address]      Time coordinating discharge: Over 30 minutes  SIGNED:   Nolberto Hanlon, MD  [facility] 06/14/2020, 10:40 AM Pager   If 7PM-7AM, please contact night-coverage [email or web address] Password [email or web address]

## 2020-06-14 NOTE — Care Management Obs Status (Signed)
MEDICARE OBSERVATION STATUS NOTIFICATION   Patient Details  Name: Shelby Butler MRN: [id number] Date of Birth: 10/07/1955   Medicare Observation Status Notification Given:  Yes    Margarito Liner, LCSW 06/14/2020, 9:38 AM

## 2020-06-25 DIAGNOSIS — I10 Essential (primary) hypertension: Secondary | ICD-10-CM | POA: Diagnosis not present

## 2020-06-25 DIAGNOSIS — K529 Noninfective gastroenteritis and colitis, unspecified: Secondary | ICD-10-CM | POA: Diagnosis not present

## 2020-06-25 DIAGNOSIS — G4700 Insomnia, unspecified: Secondary | ICD-10-CM | POA: Diagnosis not present

## 2020-06-25 DIAGNOSIS — E1140 Type 2 diabetes mellitus with diabetic neuropathy, unspecified: Secondary | ICD-10-CM | POA: Diagnosis not present

## 2020-07-10 DIAGNOSIS — N898 Other specified noninflammatory disorders of vagina: Secondary | ICD-10-CM | POA: Diagnosis not present

## 2020-07-10 DIAGNOSIS — N952 Postmenopausal atrophic vaginitis: Secondary | ICD-10-CM | POA: Diagnosis not present

## 2020-07-10 DIAGNOSIS — N958 Other specified menopausal and perimenopausal disorders: Secondary | ICD-10-CM | POA: Diagnosis not present

## 2020-08-30 DIAGNOSIS — E782 Mixed hyperlipidemia: Secondary | ICD-10-CM | POA: Diagnosis not present

## 2020-08-30 DIAGNOSIS — E1165 Type 2 diabetes mellitus with hyperglycemia: Secondary | ICD-10-CM | POA: Diagnosis not present

## 2020-08-30 DIAGNOSIS — I10 Essential (primary) hypertension: Secondary | ICD-10-CM | POA: Diagnosis not present

## 2020-09-03 DIAGNOSIS — J4520 Mild intermittent asthma, uncomplicated: Secondary | ICD-10-CM | POA: Diagnosis not present

## 2020-09-03 DIAGNOSIS — G4700 Insomnia, unspecified: Secondary | ICD-10-CM | POA: Diagnosis not present

## 2020-09-03 DIAGNOSIS — E782 Mixed hyperlipidemia: Secondary | ICD-10-CM | POA: Diagnosis not present

## 2020-09-03 DIAGNOSIS — E559 Vitamin D deficiency, unspecified: Secondary | ICD-10-CM | POA: Diagnosis not present

## 2020-09-03 DIAGNOSIS — J302 Other seasonal allergic rhinitis: Secondary | ICD-10-CM | POA: Diagnosis not present

## 2020-09-03 DIAGNOSIS — K760 Fatty (change of) liver, not elsewhere classified: Secondary | ICD-10-CM | POA: Diagnosis not present

## 2020-09-03 DIAGNOSIS — E1169 Type 2 diabetes mellitus with other specified complication: Secondary | ICD-10-CM | POA: Diagnosis not present

## 2020-09-03 DIAGNOSIS — R519 Headache, unspecified: Secondary | ICD-10-CM | POA: Diagnosis not present

## 2020-09-03 DIAGNOSIS — I10 Essential (primary) hypertension: Secondary | ICD-10-CM | POA: Diagnosis not present

## 2020-09-05 DIAGNOSIS — I10 Essential (primary) hypertension: Secondary | ICD-10-CM | POA: Diagnosis not present

## 2020-09-05 DIAGNOSIS — E1165 Type 2 diabetes mellitus with hyperglycemia: Secondary | ICD-10-CM | POA: Diagnosis not present

## 2020-09-26 DIAGNOSIS — I10 Essential (primary) hypertension: Secondary | ICD-10-CM | POA: Diagnosis not present

## 2020-09-26 DIAGNOSIS — E7800 Pure hypercholesterolemia, unspecified: Secondary | ICD-10-CM | POA: Diagnosis not present

## 2020-12-05 DIAGNOSIS — E1169 Type 2 diabetes mellitus with other specified complication: Secondary | ICD-10-CM | POA: Diagnosis not present

## 2020-12-05 DIAGNOSIS — E559 Vitamin D deficiency, unspecified: Secondary | ICD-10-CM | POA: Diagnosis not present

## 2020-12-05 DIAGNOSIS — E782 Mixed hyperlipidemia: Secondary | ICD-10-CM | POA: Diagnosis not present

## 2020-12-06 DIAGNOSIS — E785 Hyperlipidemia, unspecified: Secondary | ICD-10-CM | POA: Diagnosis not present

## 2020-12-06 DIAGNOSIS — I10 Essential (primary) hypertension: Secondary | ICD-10-CM | POA: Diagnosis not present

## 2020-12-09 DIAGNOSIS — G4700 Insomnia, unspecified: Secondary | ICD-10-CM | POA: Diagnosis not present

## 2020-12-09 DIAGNOSIS — E559 Vitamin D deficiency, unspecified: Secondary | ICD-10-CM | POA: Diagnosis not present

## 2020-12-09 DIAGNOSIS — J4520 Mild intermittent asthma, uncomplicated: Secondary | ICD-10-CM | POA: Diagnosis not present

## 2020-12-09 DIAGNOSIS — I10 Essential (primary) hypertension: Secondary | ICD-10-CM | POA: Diagnosis not present

## 2020-12-09 DIAGNOSIS — R519 Headache, unspecified: Secondary | ICD-10-CM | POA: Diagnosis not present

## 2020-12-09 DIAGNOSIS — E1169 Type 2 diabetes mellitus with other specified complication: Secondary | ICD-10-CM | POA: Diagnosis not present

## 2020-12-09 DIAGNOSIS — E782 Mixed hyperlipidemia: Secondary | ICD-10-CM | POA: Diagnosis not present

## 2020-12-09 DIAGNOSIS — J302 Other seasonal allergic rhinitis: Secondary | ICD-10-CM | POA: Diagnosis not present

## 2020-12-09 DIAGNOSIS — K760 Fatty (change of) liver, not elsewhere classified: Secondary | ICD-10-CM | POA: Diagnosis not present

## 2021-01-06 DIAGNOSIS — E7800 Pure hypercholesterolemia, unspecified: Secondary | ICD-10-CM | POA: Diagnosis not present

## 2021-01-06 DIAGNOSIS — I10 Essential (primary) hypertension: Secondary | ICD-10-CM | POA: Diagnosis not present

## 2021-01-30 ENCOUNTER — Emergency Department (HOSPITAL_COMMUNITY): Payer: Medicare HMO

## 2021-01-30 ENCOUNTER — Other Ambulatory Visit: Payer: Self-pay

## 2021-01-30 ENCOUNTER — Emergency Department (HOSPITAL_COMMUNITY)
Admission: EM | Admit: 2021-01-30 | Discharge: 2021-01-31 | Disposition: A | Discharge status: Home / Self Care | Payer: Medicare HMO | Attending: Emergency Medicine | Admitting: Emergency Medicine

## 2021-01-30 DIAGNOSIS — N2889 Other specified disorders of kidney and ureter: Secondary | ICD-10-CM | POA: Diagnosis not present

## 2021-01-30 DIAGNOSIS — Z7982 Long term (current) use of aspirin: Secondary | ICD-10-CM | POA: Diagnosis not present

## 2021-01-30 DIAGNOSIS — R1031 Right lower quadrant pain: Secondary | ICD-10-CM | POA: Insufficient documentation

## 2021-01-30 DIAGNOSIS — E785 Hyperlipidemia, unspecified: Secondary | ICD-10-CM | POA: Insufficient documentation

## 2021-01-30 DIAGNOSIS — N3289 Other specified disorders of bladder: Secondary | ICD-10-CM | POA: Diagnosis not present

## 2021-01-30 DIAGNOSIS — E1169 Type 2 diabetes mellitus with other specified complication: Secondary | ICD-10-CM | POA: Diagnosis not present

## 2021-01-30 DIAGNOSIS — J45909 Unspecified asthma, uncomplicated: Secondary | ICD-10-CM | POA: Diagnosis not present

## 2021-01-30 DIAGNOSIS — Z7984 Long term (current) use of oral hypoglycemic drugs: Secondary | ICD-10-CM | POA: Insufficient documentation

## 2021-01-30 DIAGNOSIS — R109 Unspecified abdominal pain: Secondary | ICD-10-CM

## 2021-01-30 DIAGNOSIS — I10 Essential (primary) hypertension: Secondary | ICD-10-CM | POA: Diagnosis not present

## 2021-01-30 DIAGNOSIS — Z8719 Personal history of other diseases of the digestive system: Secondary | ICD-10-CM | POA: Diagnosis not present

## 2021-01-30 DIAGNOSIS — K5730 Diverticulosis of large intestine without perforation or abscess without bleeding: Secondary | ICD-10-CM | POA: Diagnosis not present

## 2021-01-30 DIAGNOSIS — I700 Atherosclerosis of aorta: Secondary | ICD-10-CM | POA: Diagnosis not present

## 2021-01-30 DIAGNOSIS — Z79899 Other long term (current) drug therapy: Secondary | ICD-10-CM | POA: Insufficient documentation

## 2021-01-30 DIAGNOSIS — R197 Diarrhea, unspecified: Secondary | ICD-10-CM | POA: Diagnosis not present

## 2021-01-30 LAB — CBC WITH DIFFERENTIAL/PLATELET
Abs Immature Granulocytes: 0.03 10*3/uL (ref 0.00–0.07)
Basophils Absolute: 0.1 10*3/uL (ref 0.0–0.1)
Basophils Relative: 1 %
Eosinophils Absolute: 0.3 10*3/uL (ref 0.0–0.5)
Eosinophils Relative: 3 %
HCT: 42.9 % (ref 36.0–46.0)
Hemoglobin: 13.8 g/dL (ref 12.0–15.0)
Immature Granulocytes: 0 %
Lymphocytes Relative: 28 %
Lymphs Abs: 2.3 10*3/uL (ref 0.7–4.0)
MCH: 27.1 pg (ref 26.0–34.0)
MCHC: 32.2 g/dL (ref 30.0–36.0)
MCV: 84.1 fL (ref 80.0–100.0)
Monocytes Absolute: 0.7 10*3/uL (ref 0.1–1.0)
Monocytes Relative: 8 %
Neutro Abs: 5 10*3/uL (ref 1.7–7.7)
Neutrophils Relative %: 60 %
Platelets: 250 10*3/uL (ref 150–400)
RBC: 5.1 MIL/uL (ref 3.87–5.11)
RDW: 14.1 % (ref 11.5–15.5)
WBC: 8.3 10*3/uL (ref 4.0–10.5)
nRBC: 0 % (ref 0.0–0.2)

## 2021-01-30 LAB — COMPREHENSIVE METABOLIC PANEL
ALT: 31 U/L (ref 0–44)
AST: 22 U/L (ref 15–41)
Albumin: 3.9 g/dL (ref 3.5–5.0)
Alkaline Phosphatase: 103 U/L (ref 38–126)
Anion gap: 8 (ref 5–15)
BUN: 18 mg/dL (ref 8–23)
CO2: 28 mmol/L (ref 22–32)
Calcium: 9 mg/dL (ref 8.9–10.3)
Chloride: 103 mmol/L (ref 98–111)
Creatinine, Ser: 0.67 mg/dL (ref 0.44–1.00)
GFR, Estimated: >60 mL/min (ref >60)
Glucose, Bld: 160 mg/dL — ABNORMAL HIGH (ref 70–99)
Potassium: 3.7 mmol/L (ref 3.5–5.1)
Sodium: 139 mmol/L (ref 135–145)
Total Bilirubin: 0.6 mg/dL (ref 0.3–1.2)
Total Protein: 7.4 g/dL (ref 6.5–8.1)

## 2021-01-30 LAB — URINALYSIS, MICROSCOPIC (REFLEX): RBC / HPF: NONE SEEN RBC/hpf (ref 0–5)

## 2021-01-30 LAB — LIPASE, BLOOD: Lipase: 31 U/L (ref 11–51)

## 2021-01-30 LAB — URINALYSIS, ROUTINE W REFLEX MICROSCOPIC
Bilirubin Urine: NEGATIVE
Glucose, UA: >=500 mg/dL — AB
Hgb urine dipstick: NEGATIVE
Ketones, ur: NEGATIVE mg/dL
Leukocytes,Ua: NEGATIVE
Nitrite: NEGATIVE
Protein, ur: NEGATIVE mg/dL
Specific Gravity, Urine: 1.02 (ref 1.005–1.030)
pH: 6 (ref 5.0–8.0)

## 2021-01-30 MED ORDER — ONDANSETRON HCL 4 MG/2ML IJ SOLN
4.0000 mg | Freq: Once | INTRAMUSCULAR | Status: AC
  Administered 2021-01-30: 4 mg via INTRAVENOUS
  Filled 2021-01-30: qty 2

## 2021-01-30 MED ORDER — MORPHINE SULFATE (PF) 4 MG/ML IV SOLN
4.0000 mg | Freq: Once | INTRAVENOUS | Status: AC
Start: 2021-01-30 — End: 2021-01-30
  Administered 2021-01-30: 4 mg via INTRAVENOUS
  Filled 2021-01-30: qty 1

## 2021-01-30 NOTE — ED Provider Notes (Signed)
Shelby Butler   CSN: [id number] Arrival date & time: 01/30/21  2015     History Chief Complaint  Patient presents with   Flank Pain    Right side pain    Shelby Butler is a 65 y.o. female.   Flank Pain Associated symptoms include abdominal pain. Pertinent negatives include no chest pain, no headaches and no shortness of breath.      Shelby Butler is a 65 y.o. female with past medical history of hypertension, hyperlipidemia, diabetes and scoliosis who presents to the Emergency Department complaining of right flank and right lower abdominal pain.  Symptoms have been present for 3 weeks.  She describes a achy pain of her right flank that radiates to her lower abdomen.  Pain has been gradually worsening.  Worse today after eating lunch and this is what prompted her ER visit today.  She also notes having some dark formed stools for several days, but noticed diarrhea today.  She denies any nausea or vomiting, fever or chills.  No chest pain or shortness of breath.  Flank pain does not radiate into her lower extremities.  She also states that she normally takes hydrocodone daily, but has been out of her medication for 1 week.  Nothing makes her symptoms better or worse.  Past Medical History:  Diagnosis Date   Anxiety    Chronic pain    Depression    Diabetes mellitus without complication ([place])    Diverticula, colon    Hyperlipidemia    Insomnia    RLS (restless legs syndrome)    Scoliosis    Sleep apnea    Syncope     Patient Active Problem List   Diagnosis Date Noted   Acute gastroenteritis 06/13/2020   AKI (acute kidney injury) ([place]) 06/13/2020   Polypharmacy 06/13/2020   Hypotension 06/13/2020   Advance directive on file 11/19/2017   B12 deficiency 07/15/2015   Medication monitoring encounter 07/15/2015   Vitamin D deficiency 07/15/2015   Arachnoid cyst 10/29/2014   HLD (hyperlipidemia) 10/29/2014   Biceps tendinitis 10/12/2014    CFIDS (chronic fatigue and immune dysfunction syndrome) ([place]) 08/07/2014   Cannot sleep 08/07/2014   Restless leg 08/07/2014   Bronchitis, chronic, simple ([place]) 08/07/2014   Dilatation of esophagus 04/27/2014   Episode of syncope 04/24/2014   Benign essential HTN 03/06/2014   Clinical depression 03/06/2014   Combined fat and carbohydrate induced hyperlipemia 03/06/2014   Diabetes mellitus, type 2 ([place]) 03/06/2014   Chronic pain 01/09/2013   Gonalgia 01/09/2013   Diverticulitis 12/20/2012   Deafness, sensorineural 08/11/2011   Obstructive apnea 05/12/2011   Headache disorder 10/18/2010   Primary localized osteoarthrosis, lower leg 08/22/2010   Allergic rhinitis 06/17/2009   Airway hyperreactivity 01/09/2009   Essential (primary) hypertension 01/09/2009   Mild persistent asthma 01/09/2009    Past Surgical History:  Procedure Laterality Date   ABDOMINAL HYSTERECTOMY     APPENDECTOMY     BREAST EXCISIONAL BIOPSY Right 2010?   benign   KNEE SURGERY Left    x3     OB History   No obstetric history on file.     Family History  Problem Relation Age of Onset   Stroke Father    Diabetes Sister     Social History   Tobacco Use   Smoking status: Never   Smokeless tobacco: Never  Substance Use Topics   Alcohol use: Yes    Alcohol/week: 0.0 standard drinks    Comment:  on occasion   Drug use: No    Home Medications Prior to Admission medications   Medication Sig Start Date End Date Taking? Authorizing Provider  ADVAIR DISKUS 250-50 MCG/DOSE AEPB Inhale 1 puff into the lungs 2 (two) times daily. 05/15/15   Kathrine Haddock, NP  amLODipine (NORVASC) 5 MG tablet Take 5 mg by mouth at bedtime. 08/27/19   [provider]  Ascorbic Acid (VITAMIN C) 1000 MG tablet Take 1,000 mg by mouth daily.    [provider]  aspirin EC 81 MG tablet Take 81 mg by mouth daily.    [provider]  atorvastatin (LIPITOR) 40 MG tablet Take 1 tablet (40 mg total) by mouth  daily. 05/15/15   Kathrine Haddock, NP  Azelastine HCl 0.15 % SOLN Place 2 sprays into the nose in the morning and at bedtime. 08/10/18   [provider]  blood glucose meter kit and supplies KIT Dispense based on patient and insurance preference. Use up to four times daily as directed. E11.9 05/15/15   Kathrine Haddock, NP  Cholecalciferol (VITAMIN D3) 10 MCG (400 UNIT) CAPS Take 1 capsule by mouth 1 day or 1 dose.    [provider]  Dulaglutide (TRULICITY) 1.5 KD/3.2IZ SOPN Inject 0.75 mg into the skin once a week. With needles please Patient not taking: Reported on 06/13/2020 05/15/15   Kathrine Haddock, NP  gabapentin (NEURONTIN) 800 MG tablet Take 1 tablet by mouth at bedtime. 09/18/14   [provider]  glipiZIDE (GLUCOTROL) 10 MG tablet Take 10 mg by mouth 2 (two) times daily. 07/21/19   [provider]  HYDROcodone-acetaminophen (NORCO) 7.5-325 MG tablet Take 1 tablet by mouth every 6 (six) hours as needed. 09/21/19   [provider]  JARDIANCE 25 MG TABS tablet Take 25 mg by mouth daily. 08/30/19   [provider]  melatonin 5 MG TABS Take 5 mg by mouth.    [provider]  metFORMIN (GLUCOPHAGE) 1000 MG tablet Take 1 tablet (1,000 mg total) by mouth 2 (two) times daily with a meal. 05/15/15   Kathrine Haddock, NP  montelukast (SINGULAIR) 10 MG tablet Take 10 mg by mouth daily. 08/11/19   [provider]  pantoprazole (PROTONIX) 40 MG tablet Take 40 mg by mouth daily. 08/01/19   [provider]  PROAIR HFA 108 (90 Base) MCG/ACT inhaler Inhale 2 puffs into the lungs every 4 (four) hours as needed. 05/15/15   Kathrine Haddock, NP  SYMBICORT 160-4.5 MCG/ACT inhaler 1 puff 2 (two) times daily. Patient not taking: Reported on 10/23/2019 08/07/19   [provider]  tiZANidine (ZANAFLEX) 4 MG tablet Take 3 tablets by mouth at bedtime. 08/09/18   [provider]    Allergies    Patient has no known allergies.  Review of Systems    Review of Systems  Constitutional:  Negative for activity change, appetite change, chills and fever.  Respiratory:  Negative for chest tightness and shortness of breath.   Cardiovascular:  Negative for chest pain.  Gastrointestinal:  Positive for abdominal pain and diarrhea. Negative for nausea and vomiting.  Genitourinary:  Positive for flank pain. Negative for decreased urine volume, difficulty urinating and hematuria.  Musculoskeletal:  Positive for back pain.  Skin:  Negative for rash.  Neurological:  Negative for dizziness, syncope, weakness and headaches.  Psychiatric/Behavioral:  Negative for confusion.   All other systems reviewed and are negative.  Physical Exam Updated Vital Signs BP (!) 185/98 (BP Location: Right Arm)  Pulse (!) 107   Temp 98.3 F (36.8 C) (Oral)   Resp 18   Ht _0  (1.6 m)   Wt 89.8 kg   LMP  (LMP Unknown)   SpO2 95%   BMI 35.07 kg/m   Physical Exam Vitals and nursing Butler reviewed.  Constitutional:      General: She is not in acute distress.    Appearance: Normal appearance. She is not ill-appearing or toxic-appearing.  HENT:     Mouth/Throat:     Mouth: Mucous membranes are moist.  Neck:     Thyroid: No thyromegaly.     Meningeal: Kernig's sign absent.  Cardiovascular:     Rate and Rhythm: Normal rate and regular rhythm.     Pulses: Normal pulses.  Pulmonary:     Effort: Pulmonary effort is normal.     Breath sounds: Normal breath sounds. No wheezing.  Abdominal:     General: There is no distension.     Palpations: Abdomen is soft.     Tenderness: There is abdominal tenderness. There is no right CVA tenderness, left CVA tenderness, guarding or rebound.     Comments: Mild to moderate tenderness palpation of the right lower abdomen.  No CVA tenderness on exam.  Genitourinary:    Rectum: Guaiac result negative. No tenderness or external hemorrhoid. Normal anal tone.     Comments: Brown heme-negative stool on rectal exam.  No palpable  rectal mass. Musculoskeletal:        General: Normal range of motion.     Cervical back: Normal range of motion and neck supple.     Right lower leg: No edema.     Left lower leg: No edema.  Skin:    General: Skin is warm.     Capillary Refill: Capillary refill takes less than 2 seconds.     Findings: No rash.  Neurological:     General: No focal deficit present.     Mental Status: She is alert.     Sensory: No sensory deficit.     Motor: No weakness.    ED Results / Procedures / Treatments   Labs (all labs ordered are listed, but only abnormal results are displayed) Labs Reviewed  COMPREHENSIVE METABOLIC PANEL - Abnormal; Notable for the following components:      Result Value   Glucose, Bld 160 (*)    All other components within normal limits  URINALYSIS, ROUTINE W REFLEX MICROSCOPIC - Abnormal; Notable for the following components:   Glucose, UA >=500 (*)    All other components within normal limits  URINALYSIS, MICROSCOPIC (REFLEX) - Abnormal; Notable for the following components:   Bacteria, UA RARE (*)    All other components within normal limits  LIPASE, BLOOD  CBC WITH DIFFERENTIAL/PLATELET    EKG None  Radiology CT ABDOMEN PELVIS WO CONTRAST  Result Date: 01/30/2021 CLINICAL DATA:  Right flank pain for 3 weeks EXAM: CT ABDOMEN AND PELVIS WITHOUT CONTRAST TECHNIQUE: Multidetector CT imaging of the abdomen and pelvis was performed following the standard protocol without IV contrast. Unenhanced CT was performed per clinician order. Lack of IV contrast limits sensitivity and specificity, especially for evaluation of abdominal/pelvic solid viscera. COMPARISON:  None. FINDINGS: Lower chest: No acute pleural or parenchymal lung disease. Hepatobiliary: Unremarkable unenhanced appearance of the liver and gallbladder. Pancreas: Unremarkable. No pancreatic ductal dilatation or surrounding inflammatory changes. Spleen: Normal in size without focal abnormality. Adrenals/Urinary  Tract: No urinary tract calculi. Mild caliectasis upper pole right kidney. Otherwise  no obstructive uropathy within either kidney. Mild adrenals are unremarkable. Bladder is minimally distended, with no gross abnormalities. Stomach/Bowel: No bowel obstruction or ileus. The appendix is surgically absent. Diverticulosis of the sigmoid colon with no evidence of acute diverticulitis. No bowel wall thickening or inflammatory change. Vascular/Lymphatic: Aortic atherosclerosis. No enlarged abdominal or pelvic lymph nodes. Reproductive: Status post hysterectomy. No adnexal masses. Other: No free fluid or free gas.  No abdominal wall hernia. Musculoskeletal: No acute or destructive bony lesions. Reconstructed images demonstrate no additional findings. IMPRESSION: 1. No evidence of urinary tract calculi. 2. Mild caliectasis within the upper pole of the right kidney, without frank hydronephrosis. This could reflect sequela of infection or recent passage of urinary tract calculus. 3. Sigmoid diverticulosis without diverticulitis. 4.  Aortic Atherosclerosis (ICD10-I70.0). Electronically Signed   By: Randa Ngo M.D.   On: 01/30/2021 23:48    Procedures Procedures   Medications Ordered in ED Medications  ondansetron [facility]) injection 4 mg (4 mg Intravenous Given 01/30/21 2202)  morphine 4 MG/ML injection 4 mg (4 mg Intravenous Given 01/30/21 2204)    ED Course  I have reviewed the triage vital signs and the nursing notes.  Pertinent labs & imaging results that were available during my care of the patient were reviewed by me and considered in my medical decision making (see chart for details).                           Patient here with right flank and right lower abdominal pain.  Prior appendectomy.  Pain is been persistent for 3 weeks, worse today.  She also endorses having dark stool for several weeks, diarrhea today.  No bloody stools.    Labs interpreted by me, no leukocytosis.  No electrolyte  derangement.  Lipase unremarkable.  Urinalysis without evidence of infection.  CT abdomen and pelvis without evidence of ureteral calculi.  No hydronephrosis.  Patient does have a scoliosis.  Flank pain possibly musculoskeletal.  No concerning symptoms for acute process.  Pain addressed here.  She has a refill pending for her hydrocodone.  Appears appropriate for d/c home.  Agrees to close out patient follow-up with PCP. Return precautions discussed.     Final Clinical Impression(s) / ED Diagnoses Final diagnoses:  Right flank pain    Rx / DC Orders ED Discharge Orders     None        Kem Parkinson, PA-C 01/31/21 0024    Hayden Rasmussen, MD 01/31/21 1057

## 2021-01-30 NOTE — ED Triage Notes (Signed)
Right sided pain x 3 weeks wraps around from abd to flank. Pain is getting worse. Denies any injury.

## 2021-01-30 NOTE — ED Notes (Signed)
Patient currently drinking contrast. Able to ambulate to restroom with steady gait.

## 2021-01-31 MED ORDER — OXYCODONE-ACETAMINOPHEN 5-325 MG PO TABS: 1.0000 | ORAL_TABLET | Freq: Four times a day (QID) | ORAL | 0 refills | Status: DC | PRN

## 2021-01-31 MED ORDER — HYDROMORPHONE HCL 1 MG/ML IJ SOLN
1.0000 mg | Freq: Once | INTRAMUSCULAR | Status: AC
  Administered 2021-01-31: 1 mg via INTRAVENOUS
  Filled 2021-01-31: qty 1

## 2021-01-31 NOTE — Discharge Instructions (Signed)
Your work-up this evening was overall reassuring.  Your current symptoms may be related to your scoliosis.  Please call your primary care provider to arrange a follow-up appointment.  Return emergency department for any new or worsening symptoms.

## 2021-01-31 NOTE — ED Notes (Signed)
Patient left ED with ABCs intact, alert and oriented x4, respirations even and unlabored. Discharge instructions reviewed and all questions answered.   

## 2021-02-02 ENCOUNTER — Emergency Department (HOSPITAL_COMMUNITY): Payer: Medicare HMO

## 2021-02-02 ENCOUNTER — Other Ambulatory Visit: Payer: Self-pay

## 2021-02-02 ENCOUNTER — Encounter (HOSPITAL_COMMUNITY): Payer: Self-pay

## 2021-02-02 ENCOUNTER — Emergency Department (HOSPITAL_COMMUNITY)
Admission: EM | Admit: 2021-02-02 | Discharge: 2021-02-02 | Disposition: A | Discharge status: Home / Self Care | Payer: Medicare HMO | Attending: Emergency Medicine | Admitting: Emergency Medicine

## 2021-02-02 DIAGNOSIS — E1169 Type 2 diabetes mellitus with other specified complication: Secondary | ICD-10-CM | POA: Insufficient documentation

## 2021-02-02 DIAGNOSIS — K529 Noninfective gastroenteritis and colitis, unspecified: Secondary | ICD-10-CM | POA: Diagnosis not present

## 2021-02-02 DIAGNOSIS — R109 Unspecified abdominal pain: Secondary | ICD-10-CM

## 2021-02-02 DIAGNOSIS — I10 Essential (primary) hypertension: Secondary | ICD-10-CM | POA: Insufficient documentation

## 2021-02-02 DIAGNOSIS — Z20822 Contact with and (suspected) exposure to covid-19: Secondary | ICD-10-CM | POA: Diagnosis not present

## 2021-02-02 DIAGNOSIS — E860 Dehydration: Secondary | ICD-10-CM | POA: Diagnosis not present

## 2021-02-02 DIAGNOSIS — K5730 Diverticulosis of large intestine without perforation or abscess without bleeding: Secondary | ICD-10-CM | POA: Diagnosis not present

## 2021-02-02 DIAGNOSIS — I959 Hypotension, unspecified: Secondary | ICD-10-CM

## 2021-02-02 DIAGNOSIS — Z7982 Long term (current) use of aspirin: Secondary | ICD-10-CM | POA: Diagnosis not present

## 2021-02-02 DIAGNOSIS — R161 Splenomegaly, not elsewhere classified: Secondary | ICD-10-CM | POA: Diagnosis not present

## 2021-02-02 DIAGNOSIS — Z7984 Long term (current) use of oral hypoglycemic drugs: Secondary | ICD-10-CM | POA: Insufficient documentation

## 2021-02-02 DIAGNOSIS — E785 Hyperlipidemia, unspecified: Secondary | ICD-10-CM | POA: Diagnosis not present

## 2021-02-02 DIAGNOSIS — Z79899 Other long term (current) drug therapy: Secondary | ICD-10-CM | POA: Insufficient documentation

## 2021-02-02 DIAGNOSIS — J45909 Unspecified asthma, uncomplicated: Secondary | ICD-10-CM | POA: Diagnosis not present

## 2021-02-02 DIAGNOSIS — Z90710 Acquired absence of both cervix and uterus: Secondary | ICD-10-CM | POA: Diagnosis not present

## 2021-02-02 DIAGNOSIS — R519 Headache, unspecified: Secondary | ICD-10-CM | POA: Diagnosis not present

## 2021-02-02 DIAGNOSIS — I700 Atherosclerosis of aorta: Secondary | ICD-10-CM | POA: Diagnosis not present

## 2021-02-02 DIAGNOSIS — R10A0 Flank pain, unspecified side: Secondary | ICD-10-CM

## 2021-02-02 LAB — COMPREHENSIVE METABOLIC PANEL
ALT: 30 U/L (ref 0–44)
AST: 27 U/L (ref 15–41)
Albumin: 3.6 g/dL (ref 3.5–5.0)
Alkaline Phosphatase: 94 U/L (ref 38–126)
Anion gap: 12 (ref 5–15)
BUN: 27 mg/dL — ABNORMAL HIGH (ref 8–23)
CO2: 25 mmol/L (ref 22–32)
Calcium: 8.8 mg/dL — ABNORMAL LOW (ref 8.9–10.3)
Chloride: 102 mmol/L (ref 98–111)
Creatinine, Ser: 1.08 mg/dL — ABNORMAL HIGH (ref 0.44–1.00)
GFR, Estimated: 58 mL/min — ABNORMAL LOW (ref >60)
Glucose, Bld: 221 mg/dL — ABNORMAL HIGH (ref 70–99)
Potassium: 4 mmol/L (ref 3.5–5.1)
Sodium: 139 mmol/L (ref 135–145)
Total Bilirubin: 0.4 mg/dL (ref 0.3–1.2)
Total Protein: 6.6 g/dL (ref 6.5–8.1)

## 2021-02-02 LAB — CBC WITH DIFFERENTIAL/PLATELET
Abs Immature Granulocytes: 0.05 10*3/uL (ref 0.00–0.07)
Basophils Absolute: 0.1 10*3/uL (ref 0.0–0.1)
Basophils Relative: 1 %
Eosinophils Absolute: 0.4 10*3/uL (ref 0.0–0.5)
Eosinophils Relative: 4 %
HCT: 41.9 % (ref 36.0–46.0)
Hemoglobin: 12.9 g/dL (ref 12.0–15.0)
Immature Granulocytes: 1 %
Lymphocytes Relative: 21 %
Lymphs Abs: 2.1 10*3/uL (ref 0.7–4.0)
MCH: 26.8 pg (ref 26.0–34.0)
MCHC: 30.8 g/dL (ref 30.0–36.0)
MCV: 87.1 fL (ref 80.0–100.0)
Monocytes Absolute: 0.8 10*3/uL (ref 0.1–1.0)
Monocytes Relative: 8 %
Neutro Abs: 6.7 10*3/uL (ref 1.7–7.7)
Neutrophils Relative %: 65 %
Platelets: 278 10*3/uL (ref 150–400)
RBC: 4.81 MIL/uL (ref 3.87–5.11)
RDW: 14.6 % (ref 11.5–15.5)
WBC: 10.1 10*3/uL (ref 4.0–10.5)
nRBC: 0 % (ref 0.0–0.2)

## 2021-02-02 LAB — URINALYSIS, ROUTINE W REFLEX MICROSCOPIC
Bilirubin Urine: NEGATIVE
Glucose, UA: >=500 mg/dL — AB
Hgb urine dipstick: NEGATIVE
Ketones, ur: NEGATIVE mg/dL
Leukocytes,Ua: NEGATIVE
Nitrite: POSITIVE — AB
Protein, ur: NEGATIVE mg/dL
Specific Gravity, Urine: <1.005 — ABNORMAL LOW (ref 1.005–1.030)
pH: 5 (ref 5.0–8.0)

## 2021-02-02 LAB — LIPASE, BLOOD: Lipase: 30 U/L (ref 11–51)

## 2021-02-02 LAB — RESP PANEL BY RT-PCR (FLU A&B, COVID) ARPGX2
Influenza A by PCR: NEGATIVE
Influenza B by PCR: NEGATIVE
SARS Coronavirus 2 by RT PCR: NEGATIVE

## 2021-02-02 LAB — URINALYSIS, MICROSCOPIC (REFLEX)

## 2021-02-02 MED ORDER — METRONIDAZOLE 500 MG PO TABS: 500.0000 mg | ORAL_TABLET | Freq: Two times a day (BID) | ORAL | 0 refills | Status: DC

## 2021-02-02 MED ORDER — METRONIDAZOLE 500 MG PO TABS
500.0000 mg | ORAL_TABLET | Freq: Once | ORAL | Status: AC
  Administered 2021-02-02: 18:00:00 500 mg via ORAL
  Filled 2021-02-02: qty 1

## 2021-02-02 MED ORDER — MORPHINE SULFATE (PF) 4 MG/ML IV SOLN
4.0000 mg | Freq: Once | INTRAVENOUS | Status: AC
  Administered 2021-02-02: 18:00:00 4 mg via INTRAVENOUS
  Filled 2021-02-02: qty 1

## 2021-02-02 MED ORDER — CIPROFLOXACIN HCL 500 MG PO TABS: 500.0000 mg | ORAL_TABLET | Freq: Two times a day (BID) | ORAL | 0 refills | Status: DC

## 2021-02-02 MED ORDER — SODIUM CHLORIDE 0.9 % IV BOLUS
1000.0000 mL | Freq: Once | INTRAVENOUS | Status: AC
  Administered 2021-02-02: 16:00:00 1000 mL via INTRAVENOUS

## 2021-02-02 MED ORDER — CIPROFLOXACIN HCL 250 MG PO TABS
500.0000 mg | ORAL_TABLET | Freq: Once | ORAL | Status: AC
  Administered 2021-02-02: 18:00:00 500 mg via ORAL
  Filled 2021-02-02: qty 2

## 2021-02-02 MED ORDER — IOHEXOL 300 MG/ML  SOLN
100.0000 mL | Freq: Once | INTRAMUSCULAR | Status: AC | PRN
  Administered 2021-02-02: 16:00:00 100 mL via INTRAVENOUS

## 2021-02-02 MED ORDER — SODIUM CHLORIDE 0.9 % IV BOLUS
1000.0000 mL | Freq: Once | INTRAVENOUS | Status: AC
  Administered 2021-02-02: 19:00:00 1000 mL via INTRAVENOUS

## 2021-02-02 MED ORDER — ONDANSETRON HCL 4 MG/2ML IJ SOLN
4.0000 mg | Freq: Once | INTRAMUSCULAR | Status: AC
  Administered 2021-02-02: 18:00:00 4 mg via INTRAVENOUS
  Filled 2021-02-02: qty 2

## 2021-02-02 MED ORDER — HYDROCODONE-ACETAMINOPHEN 5-325 MG PO TABS: 1.0000 | ORAL_TABLET | ORAL | 0 refills | Status: DC | PRN

## 2021-02-02 NOTE — Discharge Instructions (Signed)
Hold your bp meds until your blood pressure is consistently elevated.

## 2021-02-02 NOTE — ED Triage Notes (Signed)
Husband reports that woken up twice over the last two days screaming there were flies all over her and there were bees all over her. Pt reports that she does not remember the events.

## 2021-02-02 NOTE — ED Notes (Signed)
Wheeled to lobby.  

## 2021-02-02 NOTE — ED Triage Notes (Signed)
Right side pain and low blood pressure at home. Reports SBP was in the 80s at home. Pt reports BP has been dropping like this off and on for several months.

## 2021-02-02 NOTE — ED Triage Notes (Signed)
Pt reports she has been out of hydrocodone for two weeks because it has been on back order. Pt was prescribed percocet 2 days ago at this ED and has been taking 1/2 tablet percocet and has 1 tablet left.

## 2021-02-02 NOTE — ED Provider Notes (Signed)
[facility] EMERGENCY DEPARTMENT Provider Note   CSN: [id number] Arrival date & time: 02/02/21  1438     History Chief Complaint  Patient presents with   Flank Pain    Shelby Butler is a 65 y.o. female.  Pt presents to the ED today with right sided flank pain.  The pt was here on 11/24 for the same.  Pt said she has been in bed since then because of the pain.  Her bp was low at home, so they came back.  Pt has had a few episodes where she has woken up at night screaming that there were flies and bees all over her.  Pt is also out of her pain meds.  Pt has had some diarrhea.  No vomiting.  No fever.      Past Medical History:  Diagnosis Date   Anxiety    Chronic pain    Depression    Diabetes mellitus without complication (HCC)    Diverticula, colon    Hyperlipidemia    Insomnia    RLS (restless legs syndrome)    Scoliosis    Sleep apnea    Syncope     Patient Active Problem List   Diagnosis Date Noted   Acute gastroenteritis 06/13/2020   AKI (acute kidney injury) (HCC) 06/13/2020   Polypharmacy 06/13/2020   Hypotension 06/13/2020   Advance directive on file 11/19/2017   B12 deficiency 07/15/2015   Medication monitoring encounter 07/15/2015   Vitamin D deficiency 07/15/2015   Arachnoid cyst 10/29/2014   HLD (hyperlipidemia) 10/29/2014   Biceps tendinitis 10/12/2014   CFIDS (chronic fatigue and immune dysfunction syndrome) (HCC) 08/07/2014   Cannot sleep 08/07/2014   Restless leg 08/07/2014   Bronchitis, chronic, simple (HCC) 08/07/2014   Dilatation of esophagus 04/27/2014   Episode of syncope 04/24/2014   Benign essential HTN 03/06/2014   Clinical depression 03/06/2014   Combined fat and carbohydrate induced hyperlipemia 03/06/2014   Diabetes mellitus, type 2 (HCC) 03/06/2014   Chronic pain 01/09/2013   Gonalgia 01/09/2013   Diverticulitis 12/20/2012   Deafness, sensorineural 08/11/2011   Obstructive apnea 05/12/2011   Headache disorder 10/18/2010    Primary localized osteoarthrosis, lower leg 08/22/2010   Allergic rhinitis 06/17/2009   Airway hyperreactivity 01/09/2009   Essential (primary) hypertension 01/09/2009   Mild persistent asthma 01/09/2009    Past Surgical History:  Procedure Laterality Date   ABDOMINAL HYSTERECTOMY     APPENDECTOMY     BREAST EXCISIONAL BIOPSY Right 2010?   benign   KNEE SURGERY Left    x3     OB History   No obstetric history on file.     Family History  Problem Relation Age of Onset   Stroke Father    Diabetes Sister     Social History   Tobacco Use   Smoking status: Never   Smokeless tobacco: Never  Substance Use Topics   Alcohol use: Yes    Alcohol/week: 0.0 standard drinks    Comment: on occasion   Drug use: No    Home Medications Prior to Admission medications   Medication Sig Start Date End Date Taking? Authorizing Provider  atorvastatin (LIPITOR) 40 MG tablet Take 1 tablet (40 mg total) by mouth daily. 05/15/15  Yes Wicker, Cheryl, NP  ciprofloxacin (CIPRO) 500 MG tablet Take 1 tablet (500 mg total) by mouth every 12 (twelve) hours. 02/02/21  Yes Haviland, Julie, MD  cloNIDine (CATAPRES) 0.1 MG tablet Take 0.1 mg by mouth 2 (two) times daily.   12/05/20  Yes [provider]  ergocalciferol (VITAMIN D2) 1.25 MG (50000 UT) capsule Take 50,000 Units by mouth once a week.   Yes [provider]  glipiZIDE (GLUCOTROL) 10 MG tablet Take 10 mg by mouth 2 (two) times daily. 07/21/19  Yes [provider]  HYDROcodone-acetaminophen (NORCO/VICODIN) 5-325 MG tablet Take 1 tablet by mouth every 4 (four) hours as needed. 02/02/21  Yes Isla Pence, MD  JARDIANCE 25 MG TABS tablet Take 25 mg by mouth daily. 08/30/19  Yes [provider]  melatonin 5 MG TABS Take 5 mg by mouth at bedtime.   Yes [provider]  meloxicam (MOBIC) 15 MG tablet Take 15 mg by mouth daily. 12/22/20  Yes [provider]  metFORMIN (GLUCOPHAGE) 1000 MG tablet Take 1  tablet (1,000 mg total) by mouth 2 (two) times daily with a meal. 05/15/15  Yes Kathrine Haddock, NP  metroNIDAZOLE (FLAGYL) 500 MG tablet Take 1 tablet (500 mg total) by mouth 2 (two) times daily. 02/02/21  Yes Isla Pence, MD  montelukast (SINGULAIR) 10 MG tablet Take 10 mg by mouth daily. 08/11/19  Yes [provider]  pantoprazole (PROTONIX) 40 MG tablet Take 40 mg by mouth daily. 08/01/19  Yes [provider]  pregabalin (LYRICA) 100 MG capsule Take 100 mg by mouth 3 (three) times daily. 12/11/20  Yes [provider]  PROAIR HFA 108 (90 Base) MCG/ACT inhaler Inhale 2 puffs into the lungs every 4 (four) hours as needed. 05/15/15  Yes Kathrine Haddock, NP  propranolol (INDERAL) 60 MG tablet Take 60 mg by mouth daily. 11/01/20  Yes [provider]  RYBELSUS 7 MG TABS Take 1 tablet by mouth daily. 12/11/20  Yes [provider]  tiZANidine (ZANAFLEX) 4 MG tablet Take 3 tablets by mouth at bedtime. 08/09/18  Yes [provider]  traZODone (DESYREL) 50 MG tablet Take 50 mg by mouth at bedtime as needed. 12/11/20  Yes [provider]  ADVAIR DISKUS 250-50 MCG/DOSE AEPB Inhale 1 puff into the lungs 2 (two) times daily. Patient not taking: Reported on 02/02/2021 05/15/15   Kathrine Haddock, NP  amLODipine (NORVASC) 5 MG tablet Take 5 mg by mouth at bedtime. Patient not taking: Reported on 02/02/2021 08/27/19   [provider]  Ascorbic Acid (VITAMIN C) 1000 MG tablet Take 1,000 mg by mouth daily. Patient not taking: Reported on 02/02/2021    [provider]  aspirin EC 81 MG tablet Take 81 mg by mouth daily. Patient not taking: Reported on 02/02/2021    [provider]  Azelastine HCl 0.15 % SOLN Place 2 sprays into the nose in the morning and at bedtime. Patient not taking: Reported on 02/02/2021 08/10/18   [provider]  blood glucose meter kit and supplies KIT Dispense based on patient and insurance preference. Use up  to four times daily as directed. E11.9 05/15/15   Kathrine Haddock, NP  Cholecalciferol (VITAMIN D3) 10 MCG (400 UNIT) CAPS Take 1 capsule by mouth 1 day or 1 dose. Patient not taking: Reported on 02/02/2021    [provider]  Dulaglutide (TRULICITY) 1.5 RS/8.5IO SOPN Inject 0.75 mg into the skin once a week. With needles please Patient not taking: Reported on 06/13/2020 05/15/15   Kathrine Haddock, NP  gabapentin (NEURONTIN) 800 MG tablet Take 1 tablet by mouth at bedtime. Patient not taking: Reported on 02/02/2021 09/18/14   [provider]  oxyCODONE-acetaminophen (PERCOCET/ROXICET) 5-325 MG tablet Take 1 tablet by mouth every 6 (  six) hours as needed for severe pain. Patient not taking: Reported on 02/02/2021 01/31/21   Triplett, Tammy, PA-C  SYMBICORT 160-4.5 MCG/ACT inhaler 1 puff 2 (two) times daily. Patient not taking: Reported on 10/23/2019 08/07/19   [provider]    Allergies    Patient has no known allergies.  Review of Systems   Review of Systems  Gastrointestinal:  Positive for diarrhea.  Genitourinary:  Positive for flank pain.  All other systems reviewed and are negative.  Physical Exam Updated Vital Signs BP (!) 110/51   Pulse 75   Temp 97.8 F (36.6 C) (Oral)   Resp 18   LMP  (LMP Unknown)   SpO2 92%   Physical Exam Vitals and nursing note reviewed.  Constitutional:      Appearance: Normal appearance. She is obese.  HENT:     Head: Normocephalic and atraumatic.     Right Ear: External ear normal.     Left Ear: External ear normal.     Nose: Nose normal.     Mouth/Throat:     Mouth: Mucous membranes are dry.  Eyes:     Extraocular Movements: Extraocular movements intact.     Conjunctiva/sclera: Conjunctivae normal.     Pupils: Pupils are equal, round, and reactive to light.  Cardiovascular:     Rate and Rhythm: Normal rate and regular rhythm.     Pulses: Normal pulses.     Heart sounds: Normal heart sounds.  Pulmonary:     Effort:  Pulmonary effort is normal.     Breath sounds: Normal breath sounds.  Abdominal:     General: Abdomen is flat. Bowel sounds are normal.     Palpations: Abdomen is soft.  Musculoskeletal:        General: Normal range of motion.     Cervical back: Normal range of motion and neck supple.  Skin:    General: Skin is warm.     Capillary Refill: Capillary refill takes less than 2 seconds.  Neurological:     General: No focal deficit present.     Mental Status: She is alert and oriented to person, place, and time.  Psychiatric:        Mood and Affect: Mood normal.        Behavior: Behavior normal.    ED Results / Procedures / Treatments   Labs (all labs ordered are listed, but only abnormal results are displayed) Labs Reviewed  COMPREHENSIVE METABOLIC PANEL - Abnormal; Notable for the following components:      Result Value   Glucose, Bld 221 (*)    BUN 27 (*)    Creatinine, Ser 1.08 (*)    Calcium 8.8 (*)    GFR, Estimated 58 (*)    All other components within normal limits  URINALYSIS, ROUTINE W REFLEX MICROSCOPIC - Abnormal; Notable for the following components:   APPearance HAZY (*)    Specific Gravity, Urine <1.005 (*)    Glucose, UA >=500 (*)    Nitrite POSITIVE (*)    All other components within normal limits  URINALYSIS, MICROSCOPIC (REFLEX) - Abnormal; Notable for the following components:   Bacteria, UA RARE (*)    All other components within normal limits  RESP PANEL BY RT-PCR (FLU A&B, COVID) [id number]  GASTROINTESTINAL PANEL BY PCR, STOOL (REPLACES STOOL CULTURE)  C DIFFICILE QUICK SCREEN W PCR REFLEX    CBC WITH DIFFERENTIAL/PLATELET  LIPASE, BLOOD    EKG None  Radiology CT Head Wo Contrast  Result   Date: 02/02/2021 CLINICAL DATA:  Headache. EXAM: CT HEAD WITHOUT CONTRAST TECHNIQUE: Contiguous axial images were obtained from the base of the skull through the vertex without intravenous contrast. COMPARISON:  None. FINDINGS: Brain: No evidence of acute  infarction, hemorrhage, hydrocephalus, extra-axial collection or mass lesion/mass effect. Previous left occipital craniectomy. There is wedge-shaped fluid attenuation along the left cerebellopontine angle consistent with postoperative change/encephalomalacia. Vascular: No hyperdense vessel or unexpected calcification. Skull: No acute fracture. No bone lesion. Left occipital craniectomy defect. Sinuses/Orbits: Globes and orbits are unremarkable. Visualized sinuses are clear. Other: None. IMPRESSION: 1. No acute intracranial abnormalities. Electronically Signed   By: David  Ormond M.D.   On: 02/02/2021 16:38   CT ABDOMEN PELVIS W CONTRAST  Result Date: 02/02/2021 CLINICAL DATA:  Right flank pain. EXAM: CT ABDOMEN AND PELVIS WITH CONTRAST TECHNIQUE: Multidetector CT imaging of the abdomen and pelvis was performed using the standard protocol following bolus administration of intravenous contrast. CONTRAST:  100mL OMNIPAQUE IOHEXOL 300 MG/ML  SOLN COMPARISON:  CT abdomen and pelvis 01/30/2021. FINDINGS: Lower chest: No acute abnormality. Hepatobiliary: No focal liver abnormality is seen. No gallstones, gallbladder wall thickening, or biliary dilatation. Pancreas: Unremarkable. No pancreatic ductal dilatation or surrounding inflammatory changes. Spleen: Mildly enlarged. Adrenals/Urinary Tract: Adrenal glands are unremarkable. Kidneys are normal, without renal calculi, focal lesion, or hydronephrosis. Bladder is unremarkable. Stomach/Bowel: Stomach is within normal limits. Appendix surgically absent. There is questionable mild diffuse colonic wall thickening versus normal under distension. There is no surrounding inflammatory stranding. There is sigmoid colon diverticulosis without evidence for acute diverticulitis. Small bowel and stomach are within normal limits. Vascular/Lymphatic: Aortic atherosclerosis. No enlarged abdominal or pelvic lymph nodes. Reproductive: Status post hysterectomy. No adnexal masses. Other:  No abdominal wall hernia or abnormality. No abdominopelvic ascites. Subcutaneous calcifications lateral to the left hip are nonspecific. Musculoskeletal: Degenerative changes affect the spine. No acute fractures are seen. IMPRESSION: 1. Questionable mild diffuse colonic wall thickening versus normal under distension. Correlate for mild nonspecific colitis. 2. Sigmoid colon diverticulosis without evidence for diverticulitis. 3. Mild splenomegaly. 4.  Aortic Atherosclerosis (ICD10-I70.0). Electronically Signed   By: Amy  Guttmann M.D.   On: 02/02/2021 16:46    Procedures Procedures   Medications Ordered in ED Medications  sodium chloride 0.9 % bolus 1,000 mL (1,000 mLs Intravenous Bolus 02/02/21 1534)  iohexol (OMNIPAQUE) 300 MG/ML solution 100 mL (100 mLs Intravenous Contrast Given 02/02/21 1628)  sodium chloride 0.9 % bolus 1,000 mL (1,000 mLs Intravenous Bolus 02/02/21 1832)  ciprofloxacin (CIPRO) tablet 500 mg (500 mg Oral Given 02/02/21 1745)  metroNIDAZOLE (FLAGYL) tablet 500 mg (500 mg Oral Given 02/02/21 1745)  morphine 4 MG/ML injection 4 mg (4 mg Intravenous Given 02/02/21 1747)  ondansetron (ZOFRAN) injection 4 mg (4 mg Intravenous Given 02/02/21 1747)    ED Course  I have reviewed the triage vital signs and the nursing notes.  Pertinent labs & imaging results that were available during my care of the patient were reviewed by me and considered in my medical decision making (see chart for details).    MDM Rules/Calculators/A&P                           Pt is feeling much better.  Pt has colitis on CT scan.  She is treated with cipro/flagyl.  She is told to hold bp meds.  Return if worse.  F/u with pcp. Final Clinical Impression(s) / ED Diagnoses Final diagnoses:  Colitis  Dehydration  Flank   pain  Hypotension, unspecified hypotension type    Rx / DC Orders ED Discharge Orders          Ordered    HYDROcodone-acetaminophen (NORCO/VICODIN) 5-325 MG tablet  Every 4 hours PRN         02/02/21 1916    ciprofloxacin (CIPRO) 500 MG tablet  Every 12 hours        02/02/21 1916    metroNIDAZOLE (FLAGYL) 500 MG tablet  2 times daily        02/02/21 1916             Isla Pence, MD 02/02/21 [phone]

## 2021-02-03 MED FILL — Oxycodone w/ Acetaminophen Tab 5-325 MG: ORAL | Qty: 6 | Status: AC

## 2021-02-05 DIAGNOSIS — E7800 Pure hypercholesterolemia, unspecified: Secondary | ICD-10-CM | POA: Diagnosis not present

## 2021-02-05 DIAGNOSIS — I10 Essential (primary) hypertension: Secondary | ICD-10-CM | POA: Diagnosis not present

## 2021-02-06 DIAGNOSIS — R109 Unspecified abdominal pain: Secondary | ICD-10-CM | POA: Diagnosis not present

## 2021-02-06 DIAGNOSIS — N390 Urinary tract infection, site not specified: Secondary | ICD-10-CM | POA: Diagnosis not present

## 2021-02-06 DIAGNOSIS — M546 Pain in thoracic spine: Secondary | ICD-10-CM | POA: Diagnosis not present

## 2021-02-06 DIAGNOSIS — I951 Orthostatic hypotension: Secondary | ICD-10-CM | POA: Diagnosis not present

## 2021-02-06 DIAGNOSIS — B351 Tinea unguium: Secondary | ICD-10-CM | POA: Diagnosis not present

## 2021-02-17 DIAGNOSIS — M546 Pain in thoracic spine: Secondary | ICD-10-CM | POA: Diagnosis not present

## 2021-02-17 DIAGNOSIS — M5416 Radiculopathy, lumbar region: Secondary | ICD-10-CM | POA: Diagnosis not present

## 2021-02-24 ENCOUNTER — Encounter: Payer: Self-pay | Admitting: Podiatry

## 2021-02-24 ENCOUNTER — Ambulatory Visit: Payer: Medicare HMO | Admitting: Podiatry

## 2021-02-24 ENCOUNTER — Other Ambulatory Visit: Payer: Self-pay

## 2021-02-24 DIAGNOSIS — B351 Tinea unguium: Secondary | ICD-10-CM

## 2021-02-24 NOTE — Progress Notes (Signed)
Subjective:   Patient ID: Shelby Butler, female   DOB: 65 y.o.   MRN: [id number]   HPI Patient presents concerned about discolored brittle nailbeds right and left foot that is been going on for 3 to 4 years.  States that they are not painful and she does not know whether or not treatment is possible and does not smoke likes to be active and does have quite a bit of health issues present   Review of Systems  All other systems reviewed and are negative.      Objective:  Physical Exam Vitals and nursing note reviewed.  Constitutional:      Appearance: She is well-developed.  Pulmonary:     Effort: Pulmonary effort is normal.  Musculoskeletal:        General: Normal range of motion.  Skin:    General: Skin is warm.  Neurological:     Mental Status: She is alert.    Neurovascular status is found to be intact muscle strength is found to be adequate range of motion adequate.  Patient does have nail discoloration 3 nails right to nails left localized no proximal edema erythema or drainage noted     Assessment:  Probability for mycotic nail infection with also trauma as secondary factor     Plan:  H&P reviewed condition recommended topical medicine do not recommend oral or laser and that this is not a health risk to the patient.  Education to patient given today

## 2021-03-07 DIAGNOSIS — E1169 Type 2 diabetes mellitus with other specified complication: Secondary | ICD-10-CM | POA: Diagnosis not present

## 2021-03-07 DIAGNOSIS — E782 Mixed hyperlipidemia: Secondary | ICD-10-CM | POA: Diagnosis not present

## 2021-03-07 DIAGNOSIS — E7800 Pure hypercholesterolemia, unspecified: Secondary | ICD-10-CM | POA: Diagnosis not present

## 2021-03-07 DIAGNOSIS — I10 Essential (primary) hypertension: Secondary | ICD-10-CM | POA: Diagnosis not present

## 2021-03-07 DIAGNOSIS — E559 Vitamin D deficiency, unspecified: Secondary | ICD-10-CM | POA: Diagnosis not present

## 2021-03-12 DIAGNOSIS — I10 Essential (primary) hypertension: Secondary | ICD-10-CM | POA: Diagnosis not present

## 2021-03-12 DIAGNOSIS — E559 Vitamin D deficiency, unspecified: Secondary | ICD-10-CM | POA: Diagnosis not present

## 2021-03-12 DIAGNOSIS — J4520 Mild intermittent asthma, uncomplicated: Secondary | ICD-10-CM | POA: Diagnosis not present

## 2021-03-12 DIAGNOSIS — M545 Low back pain, unspecified: Secondary | ICD-10-CM | POA: Diagnosis not present

## 2021-03-12 DIAGNOSIS — R519 Headache, unspecified: Secondary | ICD-10-CM | POA: Diagnosis not present

## 2021-03-12 DIAGNOSIS — E782 Mixed hyperlipidemia: Secondary | ICD-10-CM | POA: Diagnosis not present

## 2021-03-12 DIAGNOSIS — Z23 Encounter for immunization: Secondary | ICD-10-CM | POA: Diagnosis not present

## 2021-03-12 DIAGNOSIS — E1169 Type 2 diabetes mellitus with other specified complication: Secondary | ICD-10-CM | POA: Diagnosis not present

## 2021-03-12 DIAGNOSIS — G4700 Insomnia, unspecified: Secondary | ICD-10-CM | POA: Diagnosis not present

## 2021-05-08 DIAGNOSIS — R059 Cough, unspecified: Secondary | ICD-10-CM | POA: Diagnosis not present

## 2021-05-08 DIAGNOSIS — J0100 Acute maxillary sinusitis, unspecified: Secondary | ICD-10-CM | POA: Diagnosis not present

## 2021-05-16 DIAGNOSIS — K219 Gastro-esophageal reflux disease without esophagitis: Secondary | ICD-10-CM | POA: Diagnosis not present

## 2021-05-16 DIAGNOSIS — I10 Essential (primary) hypertension: Secondary | ICD-10-CM | POA: Diagnosis not present

## 2021-05-16 DIAGNOSIS — M545 Low back pain, unspecified: Secondary | ICD-10-CM | POA: Diagnosis not present

## 2021-05-16 DIAGNOSIS — G4700 Insomnia, unspecified: Secondary | ICD-10-CM | POA: Diagnosis not present

## 2021-05-16 DIAGNOSIS — E1169 Type 2 diabetes mellitus with other specified complication: Secondary | ICD-10-CM | POA: Diagnosis not present

## 2021-05-16 DIAGNOSIS — E782 Mixed hyperlipidemia: Secondary | ICD-10-CM | POA: Diagnosis not present

## 2021-06-05 DIAGNOSIS — J4530 Mild persistent asthma, uncomplicated: Secondary | ICD-10-CM | POA: Diagnosis not present

## 2021-06-05 DIAGNOSIS — I10 Essential (primary) hypertension: Secondary | ICD-10-CM | POA: Diagnosis not present

## 2021-06-05 DIAGNOSIS — J302 Other seasonal allergic rhinitis: Secondary | ICD-10-CM | POA: Diagnosis not present

## 2021-06-05 DIAGNOSIS — E119 Type 2 diabetes mellitus without complications: Secondary | ICD-10-CM | POA: Diagnosis not present

## 2021-06-05 DIAGNOSIS — G44221 Chronic tension-type headache, intractable: Secondary | ICD-10-CM | POA: Diagnosis not present

## 2021-06-05 DIAGNOSIS — M7918 Myalgia, other site: Secondary | ICD-10-CM | POA: Diagnosis not present

## 2021-06-19 DIAGNOSIS — E1165 Type 2 diabetes mellitus with hyperglycemia: Secondary | ICD-10-CM | POA: Diagnosis not present

## 2021-06-19 DIAGNOSIS — I10 Essential (primary) hypertension: Secondary | ICD-10-CM | POA: Diagnosis not present

## 2021-06-19 DIAGNOSIS — E559 Vitamin D deficiency, unspecified: Secondary | ICD-10-CM | POA: Diagnosis not present

## 2021-06-26 ENCOUNTER — Other Ambulatory Visit (HOSPITAL_COMMUNITY): Payer: Self-pay | Admitting: Internal Medicine

## 2021-06-26 DIAGNOSIS — E1169 Type 2 diabetes mellitus with other specified complication: Secondary | ICD-10-CM | POA: Diagnosis not present

## 2021-06-26 DIAGNOSIS — G4700 Insomnia, unspecified: Secondary | ICD-10-CM | POA: Diagnosis not present

## 2021-06-26 DIAGNOSIS — E559 Vitamin D deficiency, unspecified: Secondary | ICD-10-CM | POA: Diagnosis not present

## 2021-06-26 DIAGNOSIS — I10 Essential (primary) hypertension: Secondary | ICD-10-CM | POA: Diagnosis not present

## 2021-06-26 DIAGNOSIS — R519 Headache, unspecified: Secondary | ICD-10-CM | POA: Diagnosis not present

## 2021-06-26 DIAGNOSIS — J4520 Mild intermittent asthma, uncomplicated: Secondary | ICD-10-CM | POA: Diagnosis not present

## 2021-06-26 DIAGNOSIS — K760 Fatty (change of) liver, not elsewhere classified: Secondary | ICD-10-CM | POA: Diagnosis not present

## 2021-06-26 DIAGNOSIS — E782 Mixed hyperlipidemia: Secondary | ICD-10-CM | POA: Diagnosis not present

## 2021-06-26 DIAGNOSIS — Z1231 Encounter for screening mammogram for malignant neoplasm of breast: Secondary | ICD-10-CM

## 2021-06-26 DIAGNOSIS — M545 Low back pain, unspecified: Secondary | ICD-10-CM | POA: Diagnosis not present

## 2021-06-26 DIAGNOSIS — Z13820 Encounter for screening for osteoporosis: Secondary | ICD-10-CM

## 2021-07-07 DIAGNOSIS — G8929 Other chronic pain: Secondary | ICD-10-CM | POA: Diagnosis not present

## 2021-07-07 DIAGNOSIS — E1142 Type 2 diabetes mellitus with diabetic polyneuropathy: Secondary | ICD-10-CM | POA: Diagnosis not present

## 2021-07-07 DIAGNOSIS — R4589 Other symptoms and signs involving emotional state: Secondary | ICD-10-CM | POA: Diagnosis not present

## 2021-07-07 DIAGNOSIS — M25511 Pain in right shoulder: Secondary | ICD-10-CM | POA: Diagnosis not present

## 2021-07-14 ENCOUNTER — Ambulatory Visit (HOSPITAL_COMMUNITY)
Admission: RE | Admit: 2021-07-14 | Discharge: 2021-07-14 | Disposition: A | Discharge status: Home / Self Care | Payer: Medicare HMO | Source: Ambulatory Visit | Attending: Internal Medicine | Admitting: Internal Medicine

## 2021-07-14 DIAGNOSIS — Z1231 Encounter for screening mammogram for malignant neoplasm of breast: Secondary | ICD-10-CM | POA: Diagnosis not present

## 2021-07-14 DIAGNOSIS — Z13820 Encounter for screening for osteoporosis: Secondary | ICD-10-CM | POA: Insufficient documentation

## 2021-07-14 DIAGNOSIS — Z780 Asymptomatic menopausal state: Secondary | ICD-10-CM | POA: Diagnosis not present

## 2021-07-25 DIAGNOSIS — K3184 Gastroparesis: Secondary | ICD-10-CM | POA: Diagnosis not present

## 2021-07-25 DIAGNOSIS — E1142 Type 2 diabetes mellitus with diabetic polyneuropathy: Secondary | ICD-10-CM | POA: Diagnosis not present

## 2021-07-25 DIAGNOSIS — Z23 Encounter for immunization: Secondary | ICD-10-CM | POA: Diagnosis not present

## 2021-07-30 DIAGNOSIS — E119 Type 2 diabetes mellitus without complications: Secondary | ICD-10-CM | POA: Diagnosis not present

## 2021-07-30 DIAGNOSIS — Z0100 Encounter for examination of eyes and vision without abnormal findings: Secondary | ICD-10-CM | POA: Diagnosis not present

## 2021-08-11 DIAGNOSIS — Q675 Congenital deformity of spine: Secondary | ICD-10-CM | POA: Diagnosis not present

## 2021-08-11 DIAGNOSIS — M7918 Myalgia, other site: Secondary | ICD-10-CM | POA: Diagnosis not present

## 2021-08-11 DIAGNOSIS — K3184 Gastroparesis: Secondary | ICD-10-CM | POA: Diagnosis not present

## 2021-08-19 DIAGNOSIS — J0100 Acute maxillary sinusitis, unspecified: Secondary | ICD-10-CM | POA: Diagnosis not present

## 2021-09-30 DIAGNOSIS — Q675 Congenital deformity of spine: Secondary | ICD-10-CM | POA: Diagnosis not present

## 2021-09-30 DIAGNOSIS — M7918 Myalgia, other site: Secondary | ICD-10-CM | POA: Diagnosis not present

## 2021-09-30 DIAGNOSIS — J4530 Mild persistent asthma, uncomplicated: Secondary | ICD-10-CM | POA: Diagnosis not present

## 2021-09-30 DIAGNOSIS — M792 Neuralgia and neuritis, unspecified: Secondary | ICD-10-CM | POA: Diagnosis not present

## 2021-09-30 DIAGNOSIS — I10 Essential (primary) hypertension: Secondary | ICD-10-CM | POA: Diagnosis not present

## 2021-11-05 DIAGNOSIS — G894 Chronic pain syndrome: Secondary | ICD-10-CM | POA: Diagnosis not present

## 2021-11-05 DIAGNOSIS — E119 Type 2 diabetes mellitus without complications: Secondary | ICD-10-CM | POA: Diagnosis not present

## 2021-11-15 IMAGING — DX DG CHEST 1V PORT
1 series · 1 of 1 positions shown · non-contrast
Comparison: 12/21/2019

CLINICAL DATA: Nausea, vomiting, and diarrhea for 2 days.

EXAM:
PORTABLE CHEST 1 VIEW

[chest ap]
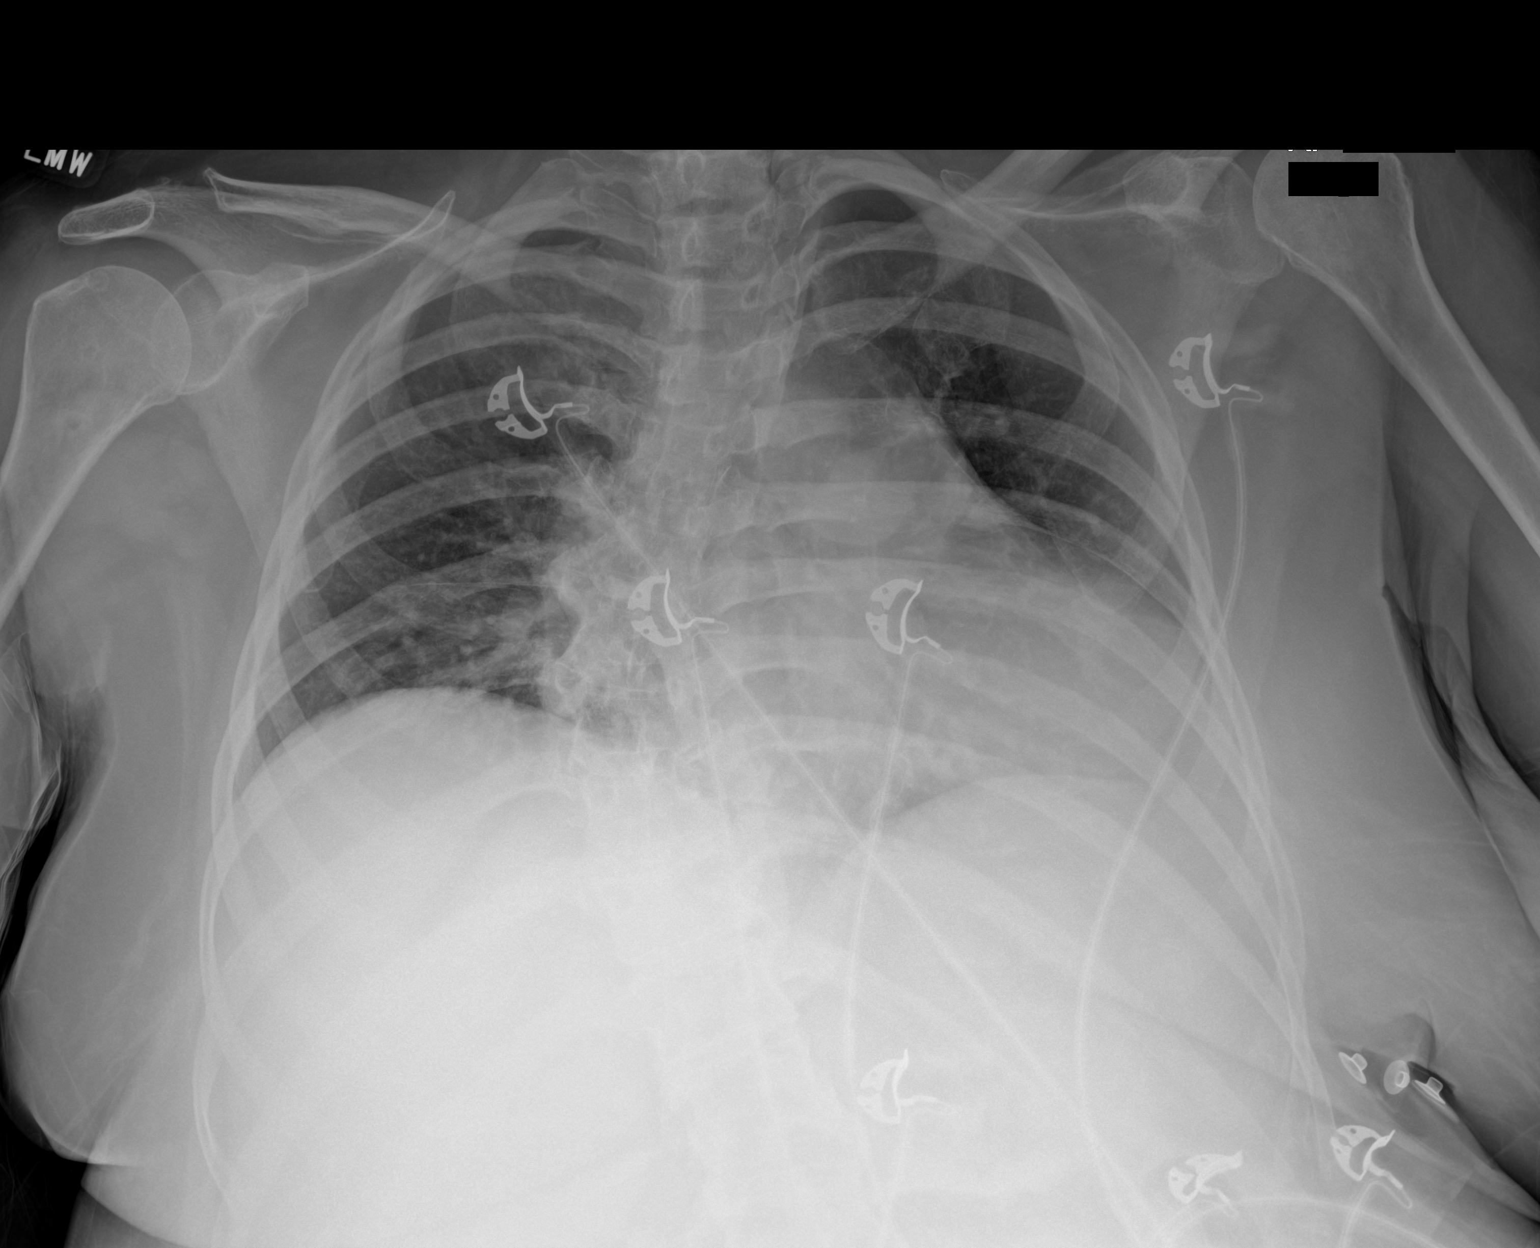

[1 of 1 positions shown; findings below may reference images not displayed]

FINDINGS: Shallow inspiration. Heart size and pulmonary vascularity are
probably normal for technique. No airspace disease or consolidation
is suggested. No pleural effusions. No pneumothorax. Thoracic
scoliosis convex towards the right. Postoperative changes in the
distal right clavicle. Postoperative changes in the cervical spine.
IMPRESSION: Shallow inspiration. No evidence of active pulmonary disease.

## 2021-12-17 DIAGNOSIS — Z79899 Other long term (current) drug therapy: Secondary | ICD-10-CM | POA: Diagnosis not present

## 2021-12-17 DIAGNOSIS — G894 Chronic pain syndrome: Secondary | ICD-10-CM | POA: Diagnosis not present

## 2021-12-17 DIAGNOSIS — E782 Mixed hyperlipidemia: Secondary | ICD-10-CM | POA: Diagnosis not present

## 2021-12-17 DIAGNOSIS — J410 Simple chronic bronchitis: Secondary | ICD-10-CM | POA: Diagnosis not present

## 2021-12-17 DIAGNOSIS — Z23 Encounter for immunization: Secondary | ICD-10-CM | POA: Diagnosis not present

## 2021-12-17 DIAGNOSIS — E1165 Type 2 diabetes mellitus with hyperglycemia: Secondary | ICD-10-CM | POA: Diagnosis not present

## 2021-12-17 DIAGNOSIS — E559 Vitamin D deficiency, unspecified: Secondary | ICD-10-CM | POA: Diagnosis not present

## 2022-01-12 DIAGNOSIS — J019 Acute sinusitis, unspecified: Secondary | ICD-10-CM | POA: Diagnosis not present

## 2022-01-12 DIAGNOSIS — B9689 Other specified bacterial agents as the cause of diseases classified elsewhere: Secondary | ICD-10-CM | POA: Diagnosis not present

## 2022-03-20 DIAGNOSIS — R519 Headache, unspecified: Secondary | ICD-10-CM | POA: Diagnosis not present

## 2022-03-20 DIAGNOSIS — G4709 Other insomnia: Secondary | ICD-10-CM | POA: Diagnosis not present

## 2022-03-20 DIAGNOSIS — G894 Chronic pain syndrome: Secondary | ICD-10-CM | POA: Diagnosis not present

## 2022-04-22 ENCOUNTER — Other Ambulatory Visit (HOSPITAL_COMMUNITY): Payer: Self-pay | Admitting: Adult Health Nurse Practitioner

## 2022-04-22 DIAGNOSIS — J0100 Acute maxillary sinusitis, unspecified: Secondary | ICD-10-CM | POA: Diagnosis not present

## 2022-04-22 DIAGNOSIS — G9389 Other specified disorders of brain: Secondary | ICD-10-CM

## 2022-04-22 DIAGNOSIS — G43701 Chronic migraine without aura, not intractable, with status migrainosus: Secondary | ICD-10-CM

## 2022-06-01 ENCOUNTER — Encounter (HOSPITAL_COMMUNITY): Payer: Self-pay

## 2022-06-01 ENCOUNTER — Ambulatory Visit (HOSPITAL_COMMUNITY): Payer: Medicare HMO

## 2022-07-01 ENCOUNTER — Other Ambulatory Visit (HOSPITAL_COMMUNITY): Payer: Self-pay | Admitting: Adult Health Nurse Practitioner

## 2022-07-01 DIAGNOSIS — G43701 Chronic migraine without aura, not intractable, with status migrainosus: Secondary | ICD-10-CM

## 2022-07-01 DIAGNOSIS — G9389 Other specified disorders of brain: Secondary | ICD-10-CM

## 2022-07-24 ENCOUNTER — Emergency Department (HOSPITAL_COMMUNITY)
Admission: EM | Admit: 2022-07-24 | Discharge: 2022-07-25 | Disposition: A | Discharge status: Home / Self Care | Payer: Medicare HMO | Attending: Emergency Medicine | Admitting: Emergency Medicine

## 2022-07-24 ENCOUNTER — Other Ambulatory Visit: Payer: Self-pay

## 2022-07-24 ENCOUNTER — Encounter (HOSPITAL_COMMUNITY): Payer: Self-pay | Admitting: Emergency Medicine

## 2022-07-24 DIAGNOSIS — Z7984 Long term (current) use of oral hypoglycemic drugs: Secondary | ICD-10-CM | POA: Insufficient documentation

## 2022-07-24 DIAGNOSIS — R8271 Bacteriuria: Secondary | ICD-10-CM | POA: Diagnosis not present

## 2022-07-24 DIAGNOSIS — N3000 Acute cystitis without hematuria: Secondary | ICD-10-CM | POA: Diagnosis not present

## 2022-07-24 DIAGNOSIS — E1165 Type 2 diabetes mellitus with hyperglycemia: Secondary | ICD-10-CM | POA: Insufficient documentation

## 2022-07-24 DIAGNOSIS — Z7982 Long term (current) use of aspirin: Secondary | ICD-10-CM | POA: Diagnosis not present

## 2022-07-24 DIAGNOSIS — Z79899 Other long term (current) drug therapy: Secondary | ICD-10-CM | POA: Diagnosis not present

## 2022-07-24 DIAGNOSIS — R109 Unspecified abdominal pain: Secondary | ICD-10-CM | POA: Diagnosis present

## 2022-07-24 LAB — CBC
HCT: 40.4 % (ref 36.0–46.0)
Hemoglobin: 12.8 g/dL (ref 12.0–15.0)
MCH: 26.4 pg (ref 26.0–34.0)
MCHC: 31.7 g/dL (ref 30.0–36.0)
MCV: 83.5 fL (ref 80.0–100.0)
Platelets: 257 10*3/uL (ref 150–400)
RBC: 4.84 MIL/uL (ref 3.87–5.11)
RDW: 14.2 % (ref 11.5–15.5)
WBC: 9.5 10*3/uL (ref 4.0–10.5)
nRBC: 0 % (ref 0.0–0.2)

## 2022-07-24 LAB — COMPREHENSIVE METABOLIC PANEL
ALT: 20 U/L (ref 0–44)
AST: 20 U/L (ref 15–41)
Albumin: 3.8 g/dL (ref 3.5–5.0)
Alkaline Phosphatase: 84 U/L (ref 38–126)
Anion gap: 11 (ref 5–15)
BUN: 26 mg/dL — ABNORMAL HIGH (ref 8–23)
CO2: 23 mmol/L (ref 22–32)
Calcium: 8.8 mg/dL — ABNORMAL LOW (ref 8.9–10.3)
Chloride: 103 mmol/L (ref 98–111)
Creatinine, Ser: 0.6 mg/dL (ref 0.44–1.00)
GFR, Estimated: >60 mL/min (ref >60)
Glucose, Bld: 137 mg/dL — ABNORMAL HIGH (ref 70–99)
Potassium: 4.3 mmol/L (ref 3.5–5.1)
Sodium: 137 mmol/L (ref 135–145)
Total Bilirubin: 0.6 mg/dL (ref 0.3–1.2)
Total Protein: 7.2 g/dL (ref 6.5–8.1)

## 2022-07-24 LAB — LIPASE, BLOOD: Lipase: 28 U/L (ref 11–51)

## 2022-07-24 MED ORDER — ONDANSETRON 8 MG PO TBDP
8.0000 mg | ORAL_TABLET | Freq: Once | ORAL | Status: AC
  Administered 2022-07-24: 8 mg via ORAL
  Filled 2022-07-24: qty 1

## 2022-07-24 MED ORDER — ACETAMINOPHEN 325 MG PO TABS
650.0000 mg | ORAL_TABLET | Freq: Once | ORAL | Status: AC
  Administered 2022-07-24: 650 mg via ORAL
  Filled 2022-07-24: qty 2

## 2022-07-24 NOTE — ED Triage Notes (Signed)
Pt with c/o R sided abdominal pain and "yellow" BM's x 2 weeks.

## 2022-07-25 LAB — URINALYSIS, ROUTINE W REFLEX MICROSCOPIC
Bilirubin Urine: NEGATIVE
Glucose, UA: NEGATIVE mg/dL
Hgb urine dipstick: NEGATIVE
Ketones, ur: NEGATIVE mg/dL
Nitrite: NEGATIVE
Protein, ur: NEGATIVE mg/dL
Specific Gravity, Urine: 1.016 (ref 1.005–1.030)
pH: 5 (ref 5.0–8.0)

## 2022-07-25 MED ORDER — CEPHALEXIN 500 MG PO CAPS: 500.0000 mg | ORAL_CAPSULE | Freq: Three times a day (TID) | ORAL | 0 refills | Status: AC

## 2022-07-25 MED ORDER — CEPHALEXIN 500 MG PO CAPS
500.0000 mg | ORAL_CAPSULE | Freq: Once | ORAL | Status: AC
  Administered 2022-07-25: 500 mg via ORAL
  Filled 2022-07-25: qty 1

## 2022-07-25 NOTE — ED Provider Notes (Signed)
[place] EMERGENCY DEPARTMENT AT [facility] Provider Note   CSN: [id number] Arrival date & time: 07/24/22  2221     History  Chief Complaint  Patient presents with   Abdominal Pain    Shelby Butler is a 67 y.o. female.  The history is provided by the patient.  Patient w/ history of anxiety, chronic pain, diabetes presents with multiple complaints.  Patient reports she had an accidental fall several weeks ago.  She reports falling on her right side and felt like she injured her right abdominal wall. She never sought care at that time.  Now over the past several days she has had loose stools that are yellow.  No bloody stools.  No fevers or vomiting.  No dysuria.  Previous history of appendectomy.  No other acute traumatic injuries are reported from the fall Patient is concerned that the yellow stools indicate an issue with her gallbladder   Past Medical History:  Diagnosis Date   Anxiety    Chronic pain    Depression    Diabetes mellitus without complication (HCC)    Diverticula, colon    Hyperlipidemia    Insomnia    RLS (restless legs syndrome)    Scoliosis    Sleep apnea    Syncope     Home Medications Prior to Admission medications   Medication Sig Start Date End Date Taking? Authorizing Provider  cephALEXin (KEFLEX) 500 MG capsule Take 1 capsule (500 mg total) by mouth 3 (three) times daily. 07/25/22  Yes Zadie Rhine, MD  cephALEXin (KEFLEX) 500 MG capsule Take 1 capsule (500 mg total) by mouth 3 (three) times daily. 07/25/22  Yes Zadie Rhine, MD  ADVAIR DISKUS 250-50 MCG/DOSE AEPB Inhale 1 puff into the lungs 2 (two) times daily. Patient not taking: Reported on 02/02/2021 05/15/15   Gabriel Cirri, NP  amLODipine (NORVASC) 5 MG tablet Take 5 mg by mouth at bedtime. Patient not taking: Reported on 02/02/2021 08/27/19   [provider]  Ascorbic Acid (VITAMIN C) 1000 MG tablet Take 1,000 mg by mouth daily. Patient not taking: Reported on  02/02/2021    [provider]  aspirin EC 81 MG tablet Take 81 mg by mouth daily. Patient not taking: Reported on 02/02/2021    [provider]  atorvastatin (LIPITOR) 40 MG tablet Take 1 tablet (40 mg total) by mouth daily. 05/15/15   Gabriel Cirri, NP  Azelastine HCl 0.15 % SOLN Place 2 sprays into the nose in the morning and at bedtime. Patient not taking: Reported on 02/02/2021 08/10/18   [provider]  blood glucose meter kit and supplies KIT Dispense based on patient and insurance preference. Use up to four times daily as directed. E11.9 05/15/15   Gabriel Cirri, NP  Cholecalciferol (VITAMIN D3) 10 MCG (400 UNIT) CAPS Take 1 capsule by mouth 1 day or 1 dose. Patient not taking: Reported on 02/02/2021    [provider]  cloNIDine (CATAPRES) 0.1 MG tablet Take 0.1 mg by mouth 2 (two) times daily. 12/05/20   [provider]  Dulaglutide (TRULICITY) 1.5 MG/0.5ML SOPN Inject 0.75 mg into the skin once a week. With needles please Patient not taking: Reported on 06/13/2020 05/15/15   Gabriel Cirri, NP  ergocalciferol (VITAMIN D2) 1.25 MG (50000 UT) capsule Take 50,000 Units by mouth once a week.    [provider]  gabapentin (NEURONTIN) 800 MG tablet Take 1 tablet by mouth at bedtime. Patient not taking: Reported on 02/02/2021 09/18/14  [provider]  glipiZIDE (GLUCOTROL) 10 MG tablet Take 10 mg by mouth 2 (two) times daily. 07/21/19   [provider]  JARDIANCE 25 MG TABS tablet Take 25 mg by mouth daily. 08/30/19   [provider]  melatonin 5 MG TABS Take 5 mg by mouth at bedtime.    [provider]  meloxicam (MOBIC) 15 MG tablet Take 15 mg by mouth daily. 12/22/20   [provider]  metFORMIN (GLUCOPHAGE) 1000 MG tablet Take 1 tablet (1,000 mg total) by mouth 2 (two) times daily with a meal. 05/15/15   Gabriel Cirri, NP  montelukast (SINGULAIR) 10 MG tablet Take 10 mg by mouth daily. 08/11/19    [provider]  pantoprazole (PROTONIX) 40 MG tablet Take 40 mg by mouth daily. 08/01/19   [provider]  pregabalin (LYRICA) 100 MG capsule Take 100 mg by mouth 3 (three) times daily. 12/11/20   [provider]  PROAIR HFA 108 (90 Base) MCG/ACT inhaler Inhale 2 puffs into the lungs every 4 (four) hours as needed. 05/15/15   Gabriel Cirri, NP  propranolol (INDERAL) 60 MG tablet Take 60 mg by mouth daily. 11/01/20   [provider]  RYBELSUS 7 MG TABS Take 1 tablet by mouth daily. 12/11/20   [provider]  SYMBICORT 160-4.5 MCG/ACT inhaler 1 puff 2 (two) times daily. Patient not taking: Reported on 10/23/2019 08/07/19   [provider]  tiZANidine (ZANAFLEX) 4 MG tablet Take 3 tablets by mouth at bedtime. 08/09/18   [provider]  traZODone (DESYREL) 50 MG tablet Take 50 mg by mouth at bedtime as needed. 12/11/20   [provider]      Allergies    Patient has no known allergies.    Review of Systems   Review of Systems  Constitutional:  Negative for fever.  Gastrointestinal:  Positive for abdominal pain.  Genitourinary:  Negative for dysuria.    Physical Exam Updated Vital Signs BP (!) 148/62 (BP Location: Left Arm)   Pulse 68   Temp (!) 97.5 F (36.4 C) (Oral)   Resp 18   Ht 1.6 m (5\' 3" )   Wt 83 kg   LMP  (LMP Unknown)   SpO2 95%   BMI 32.42 kg/m  Physical Exam CONSTITUTIONAL: Well developed/well nourished, no distress HEAD: Normocephalic/atraumatic EYES: EOMI ENMT: Mucous membranes moist NECK: supple no meningeal signs CV: S1/S2 noted, no murmurs/rubs/gallops noted LUNGS: Lungs are clear to auscultation bilaterally, no apparent distress ABDOMEN: soft, no rebound or guarding, bowel sounds noted throughout abdomen Mild tenderness over the right flank but there is no bruising noted GU:no cva tenderness NEURO: Pt is awake/alert/appropriate, moves all extremitiesx4.  No facial droop.   EXTREMITIES:  pulses normal/equal, full ROM SKIN: warm, color normal PSYCH: no abnormalities of mood noted, alert and oriented to situation  ED Results / Procedures / Treatments   Labs (all labs ordered are listed, but only abnormal results are displayed) Labs Reviewed  COMPREHENSIVE METABOLIC PANEL - Abnormal; Notable for the following components:      Result Value   Glucose, Bld 137 (*)    BUN 26 (*)    Calcium 8.8 (*)    All other components within normal limits  URINALYSIS, ROUTINE W REFLEX MICROSCOPIC - Abnormal; Notable for the following components:   Leukocytes,Ua SMALL (*)    Bacteria, UA MANY (*)    All other components within normal limits  LIPASE, BLOOD  CBC    EKG EKG  Interpretation  Date/Time:  Saturday Jul 25 2022 00:26:30 EDT Ventricular Rate:  61 PR Interval:  168 QRS Duration: 82 QT Interval:  409 QTC Calculation: 412 R Axis:   60 Text Interpretation: Sinus rhythm Low voltage, precordial leads No significant change since last tracing Confirmed by Zadie Rhine ([id number]) on 07/25/2022 12:27:52 AM  Radiology No results found.  Procedures Procedures    Medications Ordered in ED Medications  ondansetron (ZOFRAN-ODT) disintegrating tablet 8 mg (8 mg Oral Given 07/24/22 2353)  acetaminophen (TYLENOL) tablet 650 mg (650 mg Oral Given 07/24/22 2353)  cephALEXin (KEFLEX) capsule 500 mg (500 mg Oral Given 07/25/22 0126)    ED Course/ Medical Decision Making/ A&P Clinical Course as of 07/25/22 0131  Sat Jul 25, 2022  0012 Glucose(!): 137 Mild hyperglycemia [DW]  0012 Bacteria, UA(!): MANY Bacteriuria [DW]    Clinical Course User Index [DW] Zadie Rhine, MD                             Medical Decision Making Amount and/or Complexity of Data Reviewed Labs: ordered. Decision-making details documented in ED Course. ECG/medicine tests: ordered.  Risk OTC drugs. Prescription drug management.   This patient presents to the ED for concern of abdominal pain,  this involves an extensive number of treatment options, and is a complaint that carries with it a high risk of complications and morbidity.  The differential diagnosis includes but is not limited to cholecystitis, cholelithiasis, pancreatitis, gastritis, peptic ulcer disease, bowel obstruction, bowel perforation, diverticulitis, AAA, ischemic bowel    Comorbidities that complicate the patient evaluation: Patient's presentation is complicated by their history of diabetes  Additional history obtained: Records reviewed Care Everywhere/External Records  Lab Tests: I Ordered, and personally interpreted labs.  The pertinent results include: UTI, hyperglycemia  Medicines ordered and prescription drug management: I ordered medication including Tylenol for pain Reevaluation of the patient after these medicines showed that the patient    improved   Reevaluation: After the interventions noted above, I reevaluated the patient and found that they have :improved  Complexity of problems addressed: Patient's presentation is most consistent with  acute presentation with potential threat to life or bodily function  Disposition: After consideration of the diagnostic results and the patient's response to treatment,  I feel that the patent would benefit from discharge   .    Patient overall well-appearing.  She had mild tenderness in the right flank, but no bruising from recent falls.  Otherwise her exam was unremarkable.  Patient sleeping on reassessment reports feeling improved. Could be early pyelonephritis though she was unaware and had no dysuria No indication for emergent imaging at this time.  Will start on oral antibiotics.  We discussed strict return precautions       Final Clinical Impression(s) / ED Diagnoses Final diagnoses:  Flank pain  Acute cystitis without hematuria    Rx / DC Orders ED Discharge Orders          Ordered    cephALEXin (KEFLEX) 500 MG capsule  3 times daily         07/25/22 0113    cephALEXin (KEFLEX) 500 MG capsule  3 times daily        07/25/22 0131              Zadie Rhine, MD 07/25/22 [phone]

## 2022-08-14 ENCOUNTER — Ambulatory Visit (HOSPITAL_COMMUNITY)
Admission: RE | Admit: 2022-08-14 | Discharge: 2022-08-14 | Disposition: A | Discharge status: Home / Self Care | Payer: Medicare HMO | Source: Ambulatory Visit | Attending: Adult Health Nurse Practitioner | Admitting: Adult Health Nurse Practitioner

## 2022-08-14 DIAGNOSIS — G9389 Other specified disorders of brain: Secondary | ICD-10-CM | POA: Insufficient documentation

## 2022-08-14 DIAGNOSIS — G43701 Chronic migraine without aura, not intractable, with status migrainosus: Secondary | ICD-10-CM | POA: Diagnosis present

## 2022-08-14 MED ORDER — IOHEXOL 300 MG/ML  SOLN
100.0000 mL | Freq: Once | INTRAMUSCULAR | Status: AC | PRN
  Administered 2022-08-14: 75 mL via INTRAVENOUS

## 2022-09-21 ENCOUNTER — Other Ambulatory Visit (HOSPITAL_COMMUNITY): Payer: Self-pay | Admitting: Adult Health Nurse Practitioner

## 2022-09-21 DIAGNOSIS — Z1231 Encounter for screening mammogram for malignant neoplasm of breast: Secondary | ICD-10-CM

## 2022-12-16 IMAGING — MG MM DIGITAL SCREENING BILAT W/ TOMO AND CAD
8 series · 8 of 24 positions shown · non-contrast
Comparison: Previous exam(s).

CLINICAL DATA: Screening.

EXAM:
DIGITAL SCREENING BILATERAL MAMMOGRAM WITH TOMOSYNTHESIS AND CAD
TECHNIQUE: Bilateral screening digital craniocaudal and mediolateral oblique
mammograms were obtained. Bilateral screening digital breast
tomosynthesis was performed. The images were evaluated with
computer-aided detection.

[R MLO synth-2D]
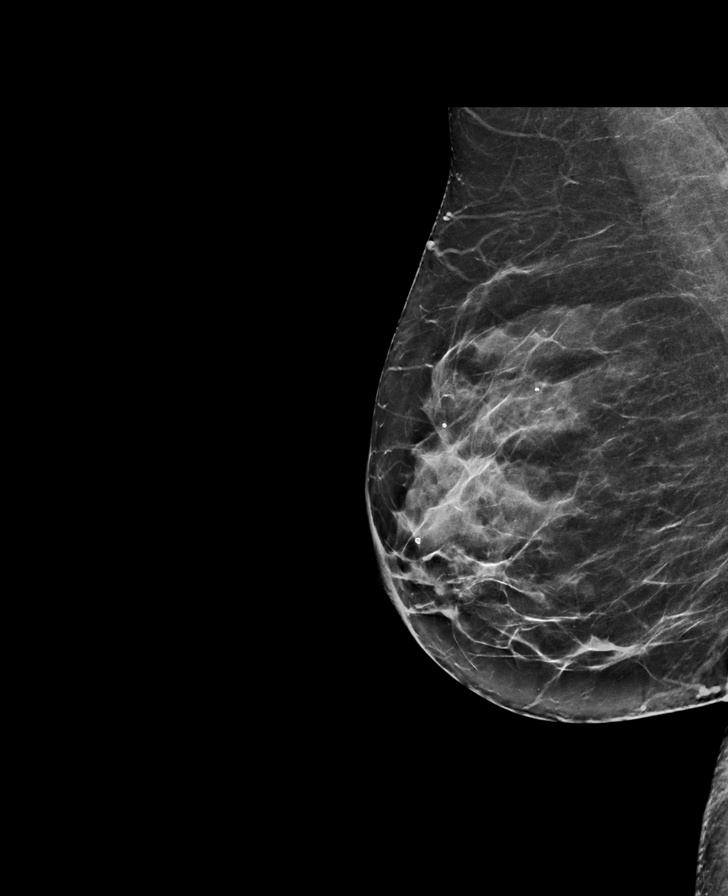

[L MLO synth-2D]
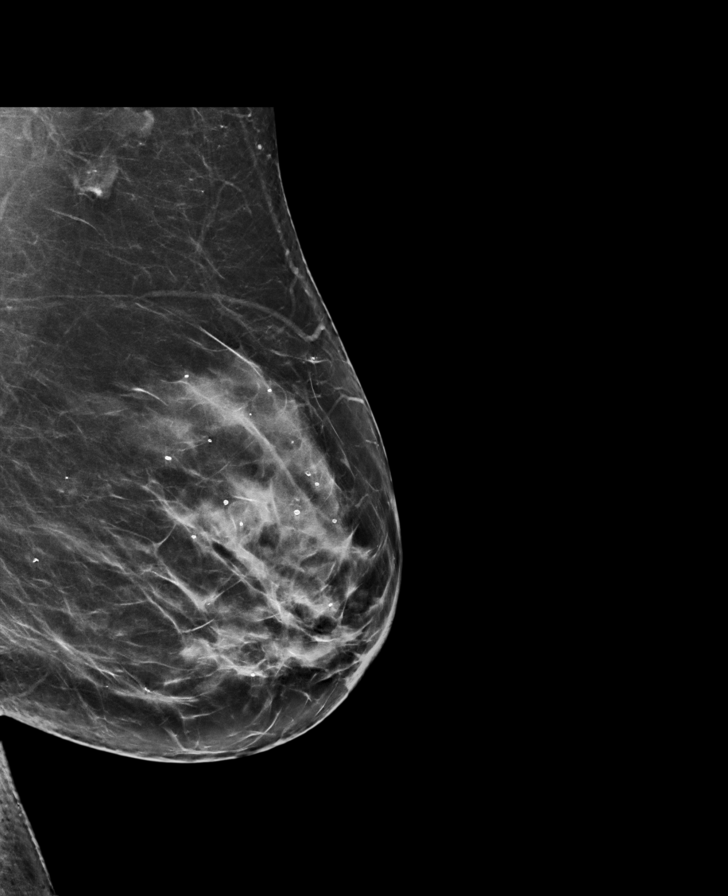

[L CC synth-2D]
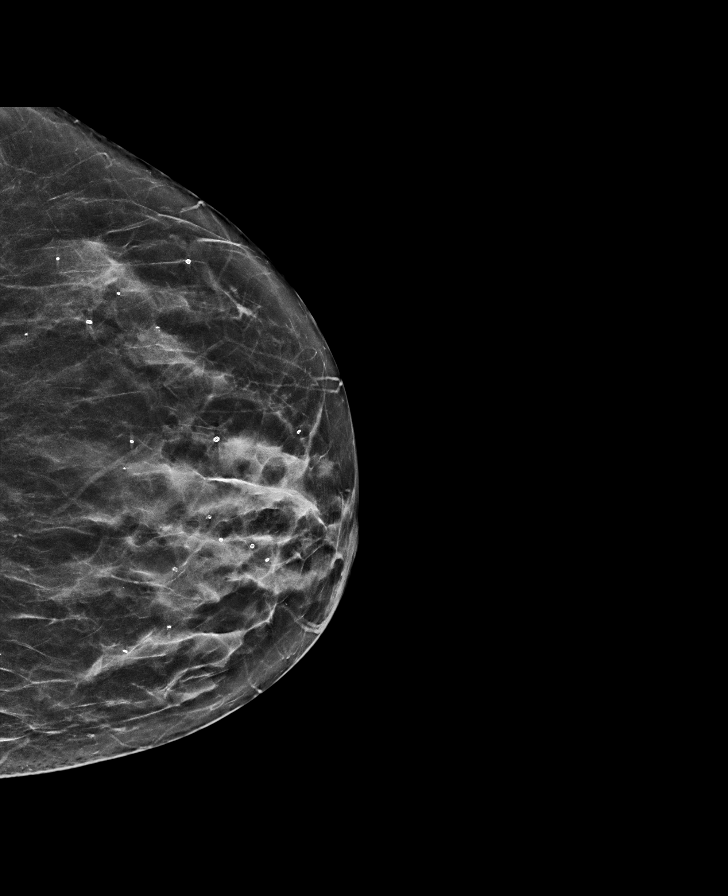

[R CC synth-2D]
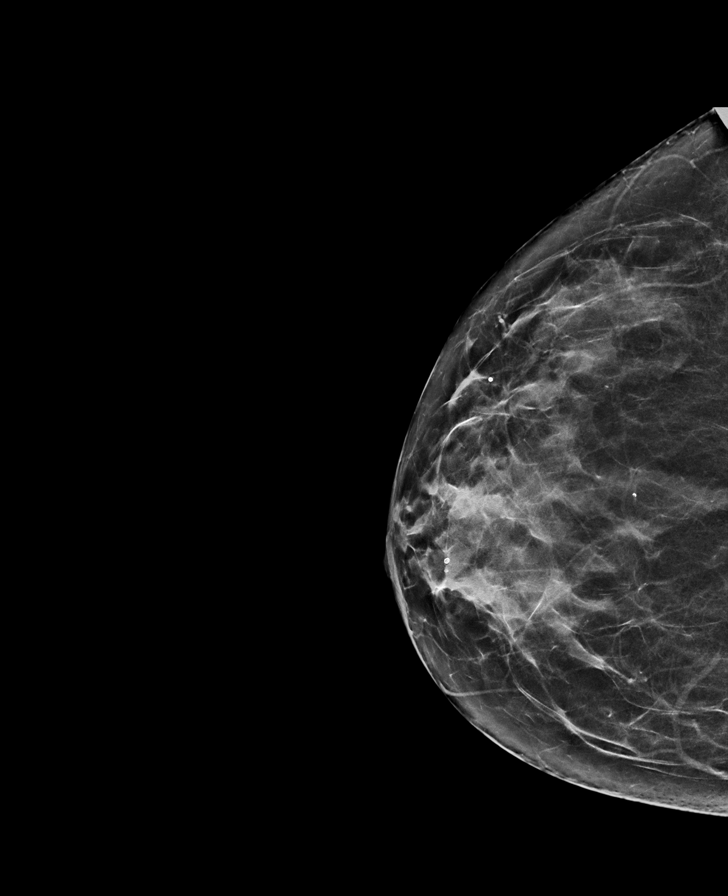

[R CC tomo · tomo slice 31/60.0]
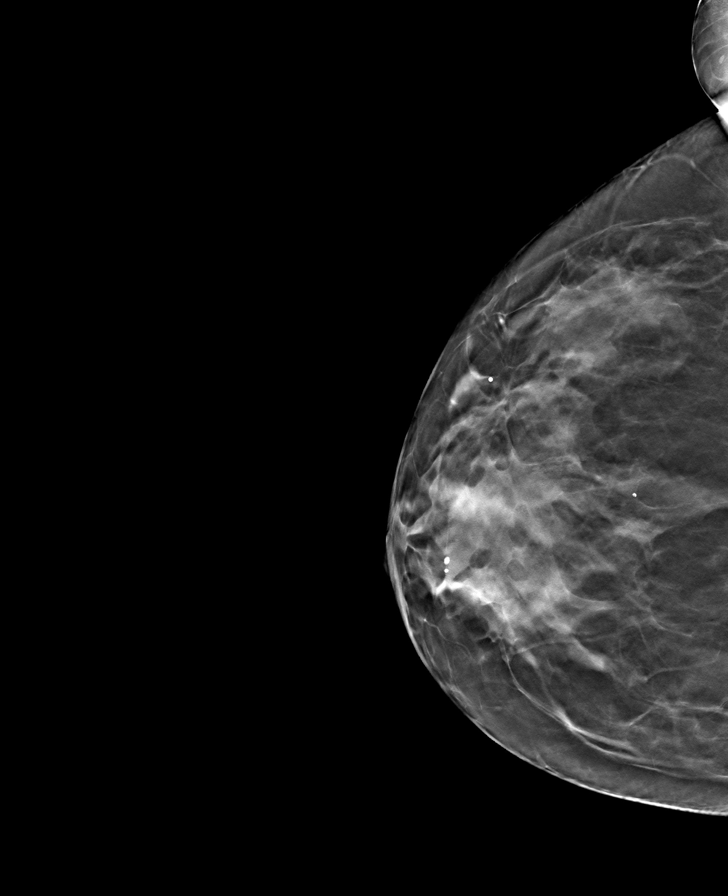

[R MLO tomo · tomo slice 35/68.0]
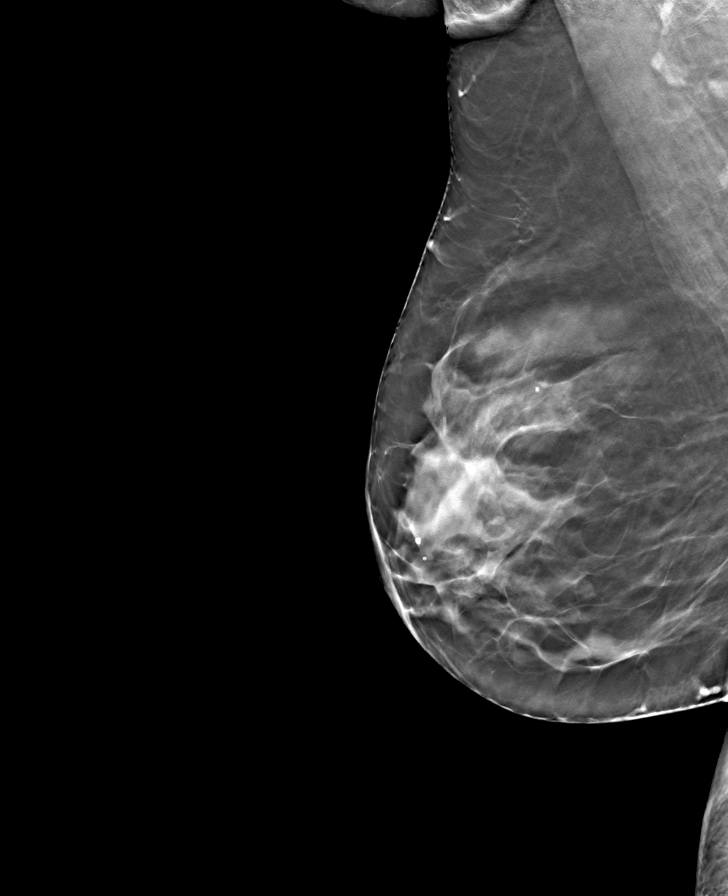

[L CC tomo · tomo slice 31/61.0]
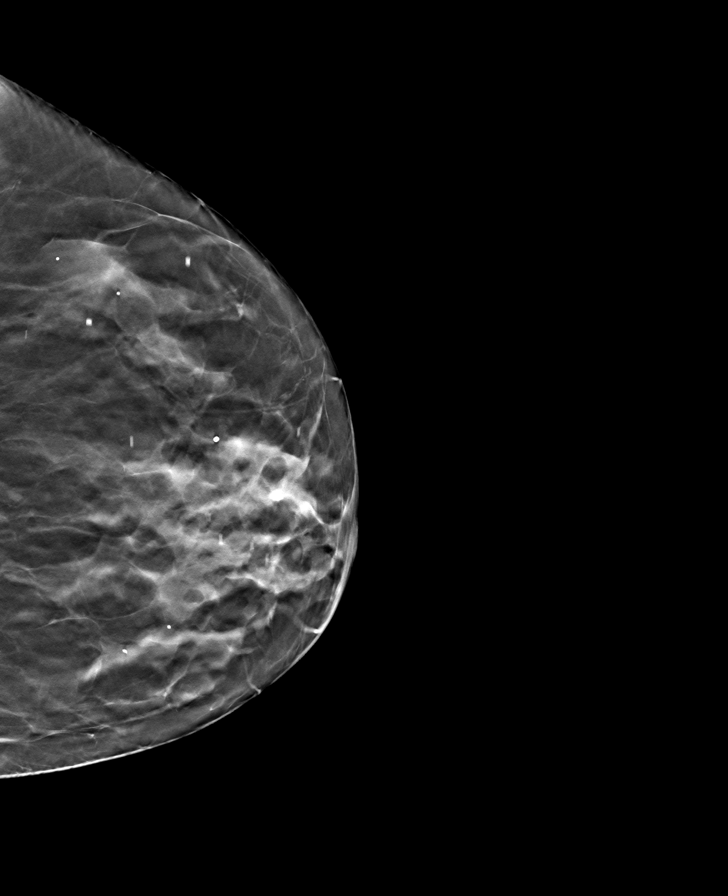

[L MLO tomo · tomo slice 39/78.0]
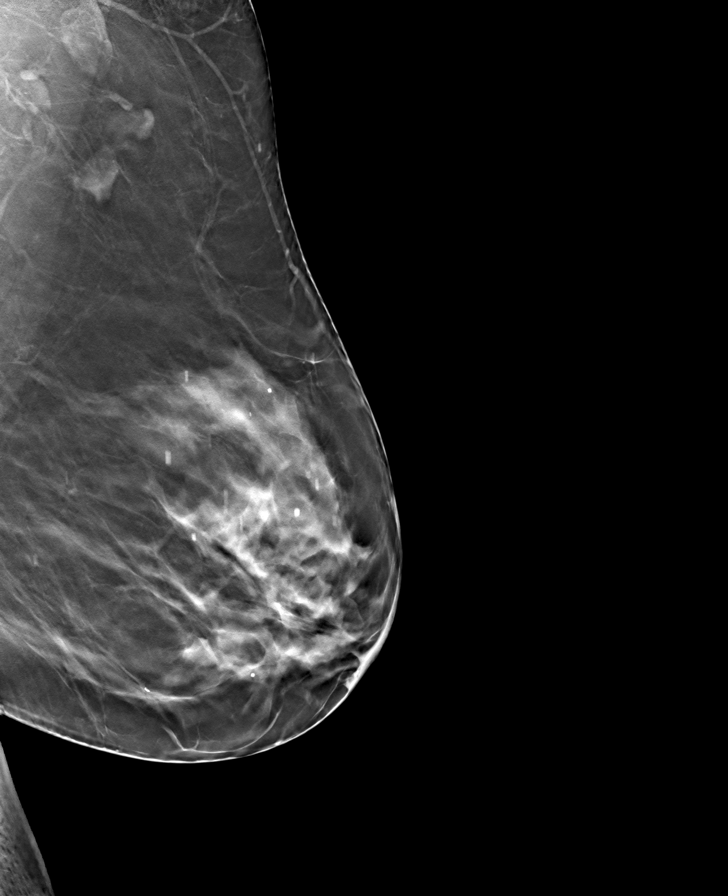

[8 of 24 positions shown; findings below may reference images not displayed]

ACR Breast Density Category c: The breast tissue is heterogeneously
dense, which may obscure small masses.
FINDINGS: There are no findings suspicious for malignancy.
IMPRESSION: No mammographic evidence of malignancy. A result letter of this
screening mammogram will be mailed directly to the patient.

RECOMMENDATION:
Screening mammogram in one year. (Code:Q3-W-BC3)

BI-RADS CATEGORY  1: Negative.

## 2023-09-28 DIAGNOSIS — Z5941 Food insecurity: Secondary | ICD-10-CM | POA: Diagnosis not present

## 2023-09-28 DIAGNOSIS — Z79899 Other long term (current) drug therapy: Secondary | ICD-10-CM | POA: Diagnosis not present

## 2023-09-28 DIAGNOSIS — I10 Essential (primary) hypertension: Secondary | ICD-10-CM | POA: Diagnosis not present

## 2023-09-28 DIAGNOSIS — R0902 Hypoxemia: Secondary | ICD-10-CM | POA: Diagnosis not present

## 2023-09-28 DIAGNOSIS — E559 Vitamin D deficiency, unspecified: Secondary | ICD-10-CM | POA: Diagnosis not present

## 2023-09-28 DIAGNOSIS — Z0000 Encounter for general adult medical examination without abnormal findings: Secondary | ICD-10-CM | POA: Diagnosis not present

## 2023-09-28 DIAGNOSIS — E782 Mixed hyperlipidemia: Secondary | ICD-10-CM | POA: Diagnosis not present

## 2023-09-28 DIAGNOSIS — E1165 Type 2 diabetes mellitus with hyperglycemia: Secondary | ICD-10-CM | POA: Diagnosis not present

## 2024-01-19 DIAGNOSIS — G894 Chronic pain syndrome: Secondary | ICD-10-CM | POA: Diagnosis not present

## 2024-01-19 DIAGNOSIS — Z23 Encounter for immunization: Secondary | ICD-10-CM | POA: Diagnosis not present
